# Patient Record
Sex: Male | Born: 1960 | Race: White | Hispanic: No | Smoking: Current every day smoker
Health system: Southern US, Community
[De-identification: ages and names within clinical notes are randomized; demographics above are authoritative.]

## PROBLEM LIST (undated history)

## (undated) DIAGNOSIS — F32A Depression, unspecified: Secondary | ICD-10-CM

## (undated) DIAGNOSIS — F1011 Alcohol abuse, in remission: Secondary | ICD-10-CM

## (undated) DIAGNOSIS — G4700 Insomnia, unspecified: Secondary | ICD-10-CM

## (undated) DIAGNOSIS — F329 Major depressive disorder, single episode, unspecified: Secondary | ICD-10-CM

## (undated) DIAGNOSIS — F419 Anxiety disorder, unspecified: Secondary | ICD-10-CM

---

## 2005-06-24 ENCOUNTER — Emergency Department: Payer: Self-pay | Admitting: Emergency Medicine

## 2005-06-24 ENCOUNTER — Other Ambulatory Visit: Payer: Self-pay

## 2006-03-02 ENCOUNTER — Other Ambulatory Visit: Payer: Self-pay

## 2006-03-02 ENCOUNTER — Emergency Department: Payer: Self-pay | Admitting: Internal Medicine

## 2006-03-30 ENCOUNTER — Emergency Department: Payer: Self-pay | Admitting: Emergency Medicine

## 2006-04-05 ENCOUNTER — Emergency Department: Payer: Self-pay | Admitting: Unknown Physician Specialty

## 2006-04-25 ENCOUNTER — Other Ambulatory Visit: Payer: Self-pay

## 2006-04-25 ENCOUNTER — Emergency Department: Payer: Self-pay | Admitting: Emergency Medicine

## 2006-05-08 ENCOUNTER — Emergency Department: Payer: Self-pay

## 2006-05-23 ENCOUNTER — Emergency Department: Payer: Self-pay | Admitting: Emergency Medicine

## 2006-08-09 ENCOUNTER — Emergency Department: Payer: Self-pay | Admitting: Emergency Medicine

## 2006-10-06 ENCOUNTER — Emergency Department: Payer: Self-pay | Admitting: General Practice

## 2006-11-20 ENCOUNTER — Emergency Department: Payer: Self-pay | Admitting: Emergency Medicine

## 2007-03-28 ENCOUNTER — Emergency Department: Payer: Self-pay | Admitting: Emergency Medicine

## 2007-04-03 ENCOUNTER — Inpatient Hospital Stay: Payer: Self-pay | Admitting: Unknown Physician Specialty

## 2007-06-03 ENCOUNTER — Other Ambulatory Visit: Payer: Self-pay

## 2007-06-03 ENCOUNTER — Emergency Department: Payer: Self-pay | Admitting: Emergency Medicine

## 2007-08-06 ENCOUNTER — Other Ambulatory Visit: Payer: Self-pay

## 2007-08-06 ENCOUNTER — Emergency Department: Payer: Self-pay | Admitting: Emergency Medicine

## 2007-09-30 ENCOUNTER — Other Ambulatory Visit: Payer: Self-pay

## 2007-09-30 ENCOUNTER — Emergency Department: Payer: Self-pay | Admitting: Internal Medicine

## 2007-10-14 ENCOUNTER — Emergency Department: Payer: Self-pay | Admitting: Emergency Medicine

## 2007-10-17 ENCOUNTER — Other Ambulatory Visit: Payer: Self-pay

## 2007-10-17 ENCOUNTER — Emergency Department: Payer: Self-pay | Admitting: Emergency Medicine

## 2007-10-28 ENCOUNTER — Emergency Department: Payer: Self-pay | Admitting: Emergency Medicine

## 2007-11-15 ENCOUNTER — Other Ambulatory Visit: Payer: Self-pay

## 2007-11-15 ENCOUNTER — Inpatient Hospital Stay: Payer: Self-pay | Admitting: Psychiatry

## 2007-12-30 ENCOUNTER — Emergency Department: Payer: Self-pay | Admitting: Internal Medicine

## 2008-01-01 ENCOUNTER — Other Ambulatory Visit: Payer: Self-pay

## 2008-01-01 ENCOUNTER — Inpatient Hospital Stay: Payer: Self-pay | Admitting: Internal Medicine

## 2008-01-15 ENCOUNTER — Emergency Department: Payer: Self-pay | Admitting: Emergency Medicine

## 2008-01-15 ENCOUNTER — Other Ambulatory Visit: Payer: Self-pay

## 2008-04-10 ENCOUNTER — Inpatient Hospital Stay: Payer: Self-pay | Admitting: Psychiatry

## 2008-04-10 ENCOUNTER — Other Ambulatory Visit: Payer: Self-pay

## 2008-07-09 ENCOUNTER — Emergency Department: Payer: Self-pay | Admitting: Unknown Physician Specialty

## 2008-12-20 ENCOUNTER — Emergency Department: Payer: Self-pay | Admitting: Emergency Medicine

## 2009-01-20 ENCOUNTER — Emergency Department: Payer: Self-pay | Admitting: Emergency Medicine

## 2010-04-24 ENCOUNTER — Emergency Department: Payer: Self-pay | Admitting: Emergency Medicine

## 2010-04-27 ENCOUNTER — Inpatient Hospital Stay: Payer: Self-pay | Admitting: Psychiatry

## 2011-01-01 ENCOUNTER — Emergency Department: Payer: Self-pay | Admitting: Emergency Medicine

## 2011-01-31 ENCOUNTER — Emergency Department: Payer: Self-pay | Admitting: Emergency Medicine

## 2011-02-22 ENCOUNTER — Emergency Department: Payer: Self-pay | Admitting: Emergency Medicine

## 2011-04-02 ENCOUNTER — Emergency Department: Payer: Self-pay | Admitting: Emergency Medicine

## 2011-04-06 ENCOUNTER — Emergency Department: Payer: Self-pay | Admitting: Emergency Medicine

## 2011-04-12 ENCOUNTER — Emergency Department: Payer: Self-pay | Admitting: Emergency Medicine

## 2011-04-21 ENCOUNTER — Emergency Department: Payer: Self-pay | Admitting: Emergency Medicine

## 2011-04-29 ENCOUNTER — Emergency Department: Payer: Self-pay | Admitting: Emergency Medicine

## 2011-09-25 ENCOUNTER — Emergency Department: Payer: Self-pay | Admitting: *Deleted

## 2011-09-25 LAB — TSH: Thyroid Stimulating Horm: 1.83 u[IU]/mL

## 2011-09-25 LAB — ETHANOL
Ethanol %: 0.214 % — ABNORMAL HIGH (ref 0.000–0.080)
Ethanol: 214 mg/dL

## 2011-09-25 LAB — CBC
HCT: 40.2 % (ref 40.0–52.0)
HGB: 13.6 g/dL (ref 13.0–18.0)
MCH: 35.7 pg — ABNORMAL HIGH (ref 26.0–34.0)
MCHC: 33.8 g/dL (ref 32.0–36.0)
Platelet: 215 10*3/uL (ref 150–440)
RBC: 3.82 10*6/uL — ABNORMAL LOW (ref 4.40–5.90)
RDW: 14.8 % — ABNORMAL HIGH (ref 11.5–14.5)
WBC: 4.7 10*3/uL (ref 3.8–10.6)

## 2011-09-25 LAB — COMPREHENSIVE METABOLIC PANEL
Alkaline Phosphatase: 68 U/L (ref 50–136)
BUN: 13 mg/dL (ref 7–18)
Bilirubin,Total: 0.2 mg/dL (ref 0.2–1.0)
Calcium, Total: 8.7 mg/dL (ref 8.5–10.1)
Chloride: 106 mmol/L (ref 98–107)
EGFR (African American): >60
Glucose: 121 mg/dL — ABNORMAL HIGH (ref 65–99)
SGOT(AST): 36 U/L (ref 15–37)
SGPT (ALT): 31 U/L
Total Protein: 7.1 g/dL (ref 6.4–8.2)

## 2011-09-25 LAB — DRUG SCREEN, URINE
Amphetamines, Ur Screen: NEGATIVE (ref ?–1000)
Cocaine Metabolite,Ur ~~LOC~~: NEGATIVE (ref ?–300)
MDMA (Ecstasy)Ur Screen: NEGATIVE (ref ?–500)
Opiate, Ur Screen: NEGATIVE (ref ?–300)
Tricyclic, Ur Screen: NEGATIVE (ref ?–1000)

## 2011-12-21 ENCOUNTER — Emergency Department: Payer: Self-pay | Admitting: Emergency Medicine

## 2011-12-21 LAB — URINALYSIS, COMPLETE
Bacteria: NONE SEEN
Bilirubin,UR: NEGATIVE
Bilirubin,UR: NEGATIVE
Blood: NEGATIVE
Blood: NEGATIVE
Glucose,UR: NEGATIVE mg/dL (ref 0–75)
Glucose,UR: NEGATIVE mg/dL (ref 0–75)
Ketone: NEGATIVE
Ketone: NEGATIVE
Leukocyte Esterase: NEGATIVE
Nitrite: NEGATIVE
Ph: 5 (ref 4.5–8.0)
Protein: NEGATIVE
Protein: NEGATIVE
RBC,UR: 1 /HPF (ref 0–5)
RBC,UR: NONE SEEN /HPF (ref 0–5)
Specific Gravity: 1.002 (ref 1.003–1.030)
Specific Gravity: 1.002 (ref 1.003–1.030)
Squamous Epithelial: NONE SEEN
Squamous Epithelial: NONE SEEN
WBC UR: NONE SEEN /HPF (ref 0–5)

## 2011-12-21 LAB — CBC
HGB: 12.6 g/dL — ABNORMAL LOW (ref 13.0–18.0)
MCH: 35.3 pg — ABNORMAL HIGH (ref 26.0–34.0)
MCHC: 33.8 g/dL (ref 32.0–36.0)
RBC: 3.57 10*6/uL — ABNORMAL LOW (ref 4.40–5.90)
WBC: 3.5 10*3/uL — ABNORMAL LOW (ref 3.8–10.6)

## 2011-12-21 LAB — COMPREHENSIVE METABOLIC PANEL
Albumin: 3.7 g/dL (ref 3.4–5.0)
Alkaline Phosphatase: 72 U/L (ref 50–136)
Anion Gap: 9 (ref 7–16)
BUN: 9 mg/dL (ref 7–18)
Bilirubin,Total: 0.3 mg/dL (ref 0.2–1.0)
Calcium, Total: 8.3 mg/dL — ABNORMAL LOW (ref 8.5–10.1)
Chloride: 107 mmol/L (ref 98–107)
Co2: 26 mmol/L (ref 21–32)
Creatinine: 0.58 mg/dL — ABNORMAL LOW (ref 0.60–1.30)
EGFR (Non-African Amer.): >60
Potassium: 3.5 mmol/L (ref 3.5–5.1)
SGPT (ALT): 18 U/L
Total Protein: 7.1 g/dL (ref 6.4–8.2)

## 2011-12-21 LAB — DRUG SCREEN, URINE
Amphetamines, Ur Screen: NEGATIVE (ref ?–1000)
Barbiturates, Ur Screen: NEGATIVE (ref ?–200)
Benzodiazepine, Ur Scrn: NEGATIVE (ref ?–200)
Cannabinoid 50 Ng, Ur ~~LOC~~: NEGATIVE (ref ?–50)
MDMA (Ecstasy)Ur Screen: NEGATIVE (ref ?–500)
Methadone, Ur Screen: NEGATIVE (ref ?–300)
Opiate, Ur Screen: NEGATIVE (ref ?–300)
Tricyclic, Ur Screen: NEGATIVE (ref ?–1000)

## 2011-12-21 LAB — ETHANOL: Ethanol %: 0.308 % (ref 0.000–0.080)

## 2012-01-03 ENCOUNTER — Emergency Department: Payer: Self-pay | Admitting: Emergency Medicine

## 2012-01-03 LAB — COMPREHENSIVE METABOLIC PANEL
BUN: 7 mg/dL (ref 7–18)
Bilirubin,Total: 0.5 mg/dL (ref 0.2–1.0)
Calcium, Total: 8.8 mg/dL (ref 8.5–10.1)
Creatinine: 0.5 mg/dL — ABNORMAL LOW (ref 0.60–1.30)
Potassium: 3.8 mmol/L (ref 3.5–5.1)
SGOT(AST): 91 U/L — ABNORMAL HIGH (ref 15–37)
SGPT (ALT): 46 U/L
Total Protein: 7.7 g/dL (ref 6.4–8.2)

## 2012-01-03 LAB — SALICYLATE LEVEL: Salicylates, Serum: 5.4 mg/dL — ABNORMAL HIGH

## 2012-01-03 LAB — DRUG SCREEN, URINE
Amphetamines, Ur Screen: NEGATIVE (ref ?–1000)
Benzodiazepine, Ur Scrn: NEGATIVE (ref ?–200)
Cocaine Metabolite,Ur ~~LOC~~: NEGATIVE (ref ?–300)
Opiate, Ur Screen: NEGATIVE (ref ?–300)

## 2012-01-03 LAB — ETHANOL
Ethanol %: 0.236 % — ABNORMAL HIGH (ref 0.000–0.080)
Ethanol %: 0.1 % — ABNORMAL HIGH (ref 0.000–0.080)
Ethanol: 100 mg/dL

## 2012-01-03 LAB — ACETAMINOPHEN LEVEL: Acetaminophen: <2 ug/mL

## 2012-01-03 LAB — CBC
HCT: 43.3 % (ref 40.0–52.0)
HGB: 14.2 g/dL (ref 13.0–18.0)
MCH: 34.7 pg — ABNORMAL HIGH (ref 26.0–34.0)
MCHC: 32.7 g/dL (ref 32.0–36.0)
Platelet: 250 10*3/uL (ref 150–440)
RBC: 4.08 10*6/uL — ABNORMAL LOW (ref 4.40–5.90)
RDW: 14.7 % — ABNORMAL HIGH (ref 11.5–14.5)
WBC: 4.8 10*3/uL (ref 3.8–10.6)

## 2012-01-03 LAB — TSH: Thyroid Stimulating Horm: 1.09 u[IU]/mL

## 2012-01-31 ENCOUNTER — Emergency Department: Payer: Self-pay | Admitting: Emergency Medicine

## 2012-01-31 LAB — DRUG SCREEN, URINE
Amphetamines, Ur Screen: NEGATIVE (ref ?–1000)
Benzodiazepine, Ur Scrn: NEGATIVE (ref ?–200)
Cocaine Metabolite,Ur ~~LOC~~: NEGATIVE (ref ?–300)
MDMA (Ecstasy)Ur Screen: NEGATIVE (ref ?–500)
Methadone, Ur Screen: NEGATIVE (ref ?–300)

## 2012-01-31 LAB — URINALYSIS, COMPLETE
Bacteria: NONE SEEN
Bilirubin,UR: NEGATIVE
Glucose,UR: NEGATIVE mg/dL (ref 0–75)
Leukocyte Esterase: NEGATIVE
Ph: 6 (ref 4.5–8.0)

## 2012-01-31 LAB — CBC
HCT: 48.8 % (ref 40.0–52.0)
MCH: 36.2 pg — ABNORMAL HIGH (ref 26.0–34.0)
MCHC: 34 g/dL (ref 32.0–36.0)
RBC: 4.58 10*6/uL (ref 4.40–5.90)
WBC: 4.3 10*3/uL (ref 3.8–10.6)

## 2012-01-31 LAB — COMPREHENSIVE METABOLIC PANEL
Albumin: 4.9 g/dL (ref 3.4–5.0)
Alkaline Phosphatase: 80 U/L (ref 50–136)
Anion Gap: 7 (ref 7–16)
BUN: 7 mg/dL (ref 7–18)
Bilirubin,Total: 0.4 mg/dL (ref 0.2–1.0)
Calcium, Total: 9.1 mg/dL (ref 8.5–10.1)
Creatinine: 0.62 mg/dL (ref 0.60–1.30)
EGFR (Non-African Amer.): >60
Glucose: 91 mg/dL (ref 65–99)
Osmolality: 270 (ref 275–301)
SGOT(AST): 48 U/L — ABNORMAL HIGH (ref 15–37)
SGPT (ALT): 30 U/L
Sodium: 136 mmol/L (ref 136–145)

## 2012-01-31 LAB — ETHANOL: Ethanol %: 0.333 % (ref 0.000–0.080)

## 2012-01-31 LAB — PHENYTOIN LEVEL, TOTAL: Dilantin: <0.4 ug/mL — ABNORMAL LOW (ref 10.0–20.0)

## 2012-03-24 LAB — COMPREHENSIVE METABOLIC PANEL
Albumin: 4.2 g/dL (ref 3.4–5.0)
Anion Gap: 4 — ABNORMAL LOW (ref 7–16)
BUN: 5 mg/dL — ABNORMAL LOW (ref 7–18)
Bilirubin,Total: 0.4 mg/dL (ref 0.2–1.0)
Creatinine: 0.71 mg/dL (ref 0.60–1.30)
Glucose: 87 mg/dL (ref 65–99)
Potassium: 3.9 mmol/L (ref 3.5–5.1)
SGOT(AST): 37 U/L (ref 15–37)
Total Protein: 8.1 g/dL (ref 6.4–8.2)

## 2012-03-24 LAB — CBC
HCT: 42.8 % (ref 40.0–52.0)
HGB: 14.6 g/dL (ref 13.0–18.0)
MCH: 36.3 pg — ABNORMAL HIGH (ref 26.0–34.0)
Platelet: 222 10*3/uL (ref 150–440)
RBC: 4.02 10*6/uL — ABNORMAL LOW (ref 4.40–5.90)
WBC: 5.8 10*3/uL (ref 3.8–10.6)

## 2012-03-24 LAB — URINALYSIS, COMPLETE
Leukocyte Esterase: NEGATIVE
Nitrite: NEGATIVE
Ph: 6 (ref 4.5–8.0)
Protein: NEGATIVE
RBC,UR: NONE SEEN /HPF (ref 0–5)
WBC UR: NONE SEEN /HPF (ref 0–5)

## 2012-03-24 LAB — TSH: Thyroid Stimulating Horm: 2.08 u[IU]/mL

## 2012-03-24 LAB — ETHANOL
Ethanol %: 0.12 % — ABNORMAL HIGH (ref 0.000–0.080)
Ethanol: 120 mg/dL

## 2012-03-24 LAB — DRUG SCREEN, URINE
Amphetamines, Ur Screen: NEGATIVE (ref ?–1000)
MDMA (Ecstasy)Ur Screen: NEGATIVE (ref ?–500)
Methadone, Ur Screen: NEGATIVE (ref ?–300)
Opiate, Ur Screen: NEGATIVE (ref ?–300)
Phencyclidine (PCP) Ur S: NEGATIVE (ref ?–25)
Tricyclic, Ur Screen: NEGATIVE (ref ?–1000)

## 2012-03-24 LAB — SALICYLATE LEVEL: Salicylates, Serum: 5.4 mg/dL — ABNORMAL HIGH

## 2012-03-25 ENCOUNTER — Inpatient Hospital Stay: Payer: Self-pay | Admitting: Psychiatry

## 2012-03-25 LAB — URINALYSIS, COMPLETE
Blood: NEGATIVE
Ketone: NEGATIVE
Nitrite: NEGATIVE
Protein: NEGATIVE
RBC,UR: 1 /HPF (ref 0–5)
Specific Gravity: 1.006 (ref 1.003–1.030)
WBC UR: <1 /HPF (ref 0–5)

## 2012-03-26 LAB — BEHAVIORAL MEDICINE 1 PANEL
Albumin: 3.6 g/dL (ref 3.4–5.0)
Alkaline Phosphatase: 99 U/L (ref 50–136)
BUN: 9 mg/dL (ref 7–18)
Basophil #: 0 10*3/uL (ref 0.0–0.1)
Bilirubin,Total: 0.5 mg/dL (ref 0.2–1.0)
Co2: 26 mmol/L (ref 21–32)
Creatinine: 0.58 mg/dL — ABNORMAL LOW (ref 0.60–1.30)
EGFR (African American): >60
EGFR (Non-African Amer.): >60
Eosinophil %: 2.4 %
Glucose: 101 mg/dL — ABNORMAL HIGH (ref 65–99)
HCT: 42.9 % (ref 40.0–52.0)
HGB: 14.8 g/dL (ref 13.0–18.0)
Lymphocyte #: 1.1 10*3/uL (ref 1.0–3.6)
Lymphocyte %: 16.6 %
MCV: 107 fL — ABNORMAL HIGH (ref 80–100)
Monocyte %: 14 %
Neutrophil #: 4.5 10*3/uL (ref 1.4–6.5)
Neutrophil %: 66.3 %
Osmolality: 273 (ref 275–301)
RBC: 4.02 10*6/uL — ABNORMAL LOW (ref 4.40–5.90)
RDW: 14.3 % (ref 11.5–14.5)
SGOT(AST): 27 U/L (ref 15–37)
SGPT (ALT): 21 U/L
Sodium: 137 mmol/L (ref 136–145)
Total Protein: 7.3 g/dL (ref 6.4–8.2)
WBC: 6.8 10*3/uL (ref 3.8–10.6)

## 2012-03-28 LAB — ETHANOL: Ethanol %: 0.329 % (ref 0.000–0.080)

## 2012-04-07 ENCOUNTER — Emergency Department: Payer: Self-pay | Admitting: *Deleted

## 2012-04-07 LAB — CBC
HCT: 39 % — ABNORMAL LOW (ref 40.0–52.0)
HGB: 13.3 g/dL (ref 13.0–18.0)
MCHC: 34 g/dL (ref 32.0–36.0)
RBC: 3.65 10*6/uL — ABNORMAL LOW (ref 4.40–5.90)
RDW: 14.1 % (ref 11.5–14.5)
WBC: 5 10*3/uL (ref 3.8–10.6)

## 2012-04-07 LAB — COMPREHENSIVE METABOLIC PANEL
Anion Gap: 7 (ref 7–16)
BUN: 7 mg/dL (ref 7–18)
Bilirubin,Total: 0.2 mg/dL (ref 0.2–1.0)
Calcium, Total: 8.2 mg/dL — ABNORMAL LOW (ref 8.5–10.1)
Chloride: 106 mmol/L (ref 98–107)
Co2: 24 mmol/L (ref 21–32)
Creatinine: 0.56 mg/dL — ABNORMAL LOW (ref 0.60–1.30)
EGFR (African American): >60
EGFR (Non-African Amer.): >60
Osmolality: 271 (ref 275–301)
Potassium: 3.7 mmol/L (ref 3.5–5.1)
SGPT (ALT): 18 U/L
Total Protein: 7.3 g/dL (ref 6.4–8.2)

## 2012-04-07 LAB — TROPONIN I: Troponin-I: <0.02 ng/mL

## 2012-04-07 LAB — CK TOTAL AND CKMB (NOT AT ARMC)
CK, Total: 132 U/L (ref 35–232)
CK-MB: 1.7 ng/mL (ref 0.5–3.6)

## 2012-08-17 ENCOUNTER — Emergency Department: Payer: Self-pay | Admitting: Emergency Medicine

## 2012-08-17 LAB — COMPREHENSIVE METABOLIC PANEL
Albumin: 3.9 g/dL (ref 3.4–5.0)
Alkaline Phosphatase: 76 U/L (ref 50–136)
Bilirubin,Total: 0.3 mg/dL (ref 0.2–1.0)
Calcium, Total: 8.2 mg/dL — ABNORMAL LOW (ref 8.5–10.1)
Creatinine: 0.55 mg/dL — ABNORMAL LOW (ref 0.60–1.30)
Glucose: 94 mg/dL (ref 65–99)
SGOT(AST): 34 U/L (ref 15–37)
Total Protein: 7.6 g/dL (ref 6.4–8.2)

## 2012-08-17 LAB — URINALYSIS, COMPLETE
Bacteria: NONE SEEN
Bilirubin,UR: NEGATIVE
Ketone: NEGATIVE
Leukocyte Esterase: NEGATIVE
Nitrite: NEGATIVE
Ph: 5 (ref 4.5–8.0)
RBC,UR: NONE SEEN /HPF (ref 0–5)
Specific Gravity: 1.005 (ref 1.003–1.030)
WBC UR: NONE SEEN /HPF (ref 0–5)

## 2012-08-17 LAB — DRUG SCREEN, URINE
Amphetamines, Ur Screen: NEGATIVE (ref ?–1000)
Barbiturates, Ur Screen: NEGATIVE (ref ?–200)
Benzodiazepine, Ur Scrn: NEGATIVE (ref ?–200)
Cannabinoid 50 Ng, Ur ~~LOC~~: NEGATIVE (ref ?–50)
MDMA (Ecstasy)Ur Screen: NEGATIVE (ref ?–500)

## 2012-08-17 LAB — SALICYLATE LEVEL: Salicylates, Serum: 4.7 mg/dL — ABNORMAL HIGH

## 2012-08-17 LAB — CBC
HCT: 42.5 % (ref 40.0–52.0)
HGB: 14.1 g/dL (ref 13.0–18.0)
MCH: 35 pg — ABNORMAL HIGH (ref 26.0–34.0)
MCHC: 33.1 g/dL (ref 32.0–36.0)
RBC: 4.03 10*6/uL — ABNORMAL LOW (ref 4.40–5.90)
WBC: 5.5 10*3/uL (ref 3.8–10.6)

## 2012-08-17 LAB — ETHANOL
Ethanol %: 0.35 % (ref 0.000–0.080)
Ethanol: 350 mg/dL

## 2012-08-17 LAB — TSH: Thyroid Stimulating Horm: 1.83 u[IU]/mL

## 2012-10-02 ENCOUNTER — Emergency Department: Payer: Self-pay | Admitting: Emergency Medicine

## 2012-10-04 ENCOUNTER — Emergency Department: Payer: Self-pay | Admitting: Emergency Medicine

## 2012-10-04 LAB — DRUG SCREEN, URINE
Amphetamines, Ur Screen: NEGATIVE (ref ?–1000)
Barbiturates, Ur Screen: NEGATIVE (ref ?–200)
Benzodiazepine, Ur Scrn: NEGATIVE (ref ?–200)
Cannabinoid 50 Ng, Ur ~~LOC~~: NEGATIVE (ref ?–50)
MDMA (Ecstasy)Ur Screen: NEGATIVE (ref ?–500)
Methadone, Ur Screen: NEGATIVE (ref ?–300)
Opiate, Ur Screen: NEGATIVE (ref ?–300)
Phencyclidine (PCP) Ur S: NEGATIVE (ref ?–25)
Tricyclic, Ur Screen: NEGATIVE (ref ?–1000)

## 2012-10-04 LAB — COMPREHENSIVE METABOLIC PANEL
Alkaline Phosphatase: 98 U/L (ref 50–136)
Anion Gap: 7 (ref 7–16)
BUN: 8 mg/dL (ref 7–18)
Bilirubin,Total: 0.2 mg/dL (ref 0.2–1.0)
Calcium, Total: 8.9 mg/dL (ref 8.5–10.1)
Chloride: 107 mmol/L (ref 98–107)
EGFR (African American): >60
EGFR (Non-African Amer.): >60
Glucose: 88 mg/dL (ref 65–99)
Osmolality: 273 (ref 275–301)
SGOT(AST): 35 U/L (ref 15–37)
Sodium: 138 mmol/L (ref 136–145)
Total Protein: 7.8 g/dL (ref 6.4–8.2)

## 2012-10-04 LAB — URINALYSIS, COMPLETE
Bacteria: NONE SEEN
Bilirubin,UR: NEGATIVE
Blood: NEGATIVE
Glucose,UR: NEGATIVE mg/dL (ref 0–75)
Ketone: NEGATIVE
Leukocyte Esterase: NEGATIVE
Nitrite: NEGATIVE
Protein: NEGATIVE
RBC,UR: NONE SEEN /HPF (ref 0–5)
Specific Gravity: 1.002 (ref 1.003–1.030)
Squamous Epithelial: NONE SEEN
WBC UR: NONE SEEN /HPF (ref 0–5)

## 2012-10-04 LAB — CBC
HCT: 43 % (ref 40.0–52.0)
MCH: 35.6 pg — ABNORMAL HIGH (ref 26.0–34.0)
MCV: 104 fL — ABNORMAL HIGH (ref 80–100)
WBC: 6.2 10*3/uL (ref 3.8–10.6)

## 2012-10-04 LAB — ETHANOL
Ethanol %: 0.26 % — ABNORMAL HIGH (ref 0.000–0.080)
Ethanol: 260 mg/dL

## 2012-10-04 LAB — TSH: Thyroid Stimulating Horm: 1.12 u[IU]/mL

## 2012-10-29 ENCOUNTER — Emergency Department: Payer: Self-pay | Admitting: Internal Medicine

## 2012-10-29 LAB — COMPREHENSIVE METABOLIC PANEL
Albumin: 4.5 g/dL (ref 3.4–5.0)
Alkaline Phosphatase: 90 U/L (ref 50–136)
Anion Gap: 7 (ref 7–16)
BUN: 7 mg/dL (ref 7–18)
Bilirubin,Total: 0.3 mg/dL (ref 0.2–1.0)
Calcium, Total: 9 mg/dL (ref 8.5–10.1)
Chloride: 106 mmol/L (ref 98–107)
Co2: 27 mmol/L (ref 21–32)
EGFR (Non-African Amer.): >60
Osmolality: 277 (ref 275–301)
Potassium: 4 mmol/L (ref 3.5–5.1)
SGPT (ALT): 23 U/L (ref 12–78)
Total Protein: 8 g/dL (ref 6.4–8.2)

## 2012-10-29 LAB — URINALYSIS, COMPLETE
Blood: NEGATIVE
Ketone: NEGATIVE
Ph: 5 (ref 4.5–8.0)
Protein: NEGATIVE
RBC,UR: NONE SEEN /HPF (ref 0–5)
Specific Gravity: 1.005 (ref 1.003–1.030)

## 2012-10-29 LAB — CBC
HCT: 42.6 % (ref 40.0–52.0)
MCH: 35.8 pg — ABNORMAL HIGH (ref 26.0–34.0)
Platelet: 257 10*3/uL (ref 150–440)
RDW: 14.2 % (ref 11.5–14.5)

## 2012-10-29 LAB — TSH: Thyroid Stimulating Horm: 0.886 u[IU]/mL

## 2012-10-29 LAB — ETHANOL
Ethanol %: 0.285 % — ABNORMAL HIGH (ref 0.000–0.080)
Ethanol: 285 mg/dL

## 2012-10-29 LAB — DRUG SCREEN, URINE
Cannabinoid 50 Ng, Ur ~~LOC~~: NEGATIVE (ref ?–50)
Cocaine Metabolite,Ur ~~LOC~~: NEGATIVE (ref ?–300)
Methadone, Ur Screen: NEGATIVE (ref ?–300)
Phencyclidine (PCP) Ur S: NEGATIVE (ref ?–25)

## 2012-11-27 ENCOUNTER — Emergency Department: Payer: Self-pay | Admitting: Internal Medicine

## 2012-11-28 ENCOUNTER — Emergency Department: Payer: Self-pay | Admitting: Emergency Medicine

## 2012-11-28 LAB — DRUG SCREEN, URINE
Amphetamines, Ur Screen: NEGATIVE (ref ?–1000)
Barbiturates, Ur Screen: NEGATIVE (ref ?–200)
Cannabinoid 50 Ng, Ur ~~LOC~~: NEGATIVE (ref ?–50)
Methadone, Ur Screen: NEGATIVE (ref ?–300)
Phencyclidine (PCP) Ur S: NEGATIVE (ref ?–25)

## 2012-11-28 LAB — CBC
HCT: 43.9 % (ref 40.0–52.0)
HGB: 14.9 g/dL (ref 13.0–18.0)
MCV: 105 fL — ABNORMAL HIGH (ref 80–100)
RDW: 14.3 % (ref 11.5–14.5)

## 2012-11-28 LAB — COMPREHENSIVE METABOLIC PANEL
Albumin: 3.7 g/dL (ref 3.4–5.0)
Alkaline Phosphatase: 118 U/L (ref 50–136)
Anion Gap: 6 — ABNORMAL LOW (ref 7–16)
BUN: 10 mg/dL (ref 7–18)
Calcium, Total: 8.6 mg/dL (ref 8.5–10.1)
Chloride: 107 mmol/L (ref 98–107)
Creatinine: 0.72 mg/dL (ref 0.60–1.30)
Glucose: 95 mg/dL (ref 65–99)
Osmolality: 282 (ref 275–301)
Potassium: 3.7 mmol/L (ref 3.5–5.1)
SGPT (ALT): 25 U/L (ref 12–78)
Sodium: 142 mmol/L (ref 136–145)
Total Protein: 7.9 g/dL (ref 6.4–8.2)

## 2012-11-28 LAB — ETHANOL: Ethanol: 275 mg/dL

## 2012-11-28 LAB — URINALYSIS, COMPLETE
Bilirubin,UR: NEGATIVE
Blood: NEGATIVE
Glucose,UR: NEGATIVE mg/dL (ref 0–75)
Ph: 5 (ref 4.5–8.0)
Squamous Epithelial: <1
WBC UR: <1 /HPF (ref 0–5)

## 2012-11-28 LAB — ACETAMINOPHEN LEVEL: Acetaminophen: <2 ug/mL

## 2012-11-28 LAB — TSH: Thyroid Stimulating Horm: 2.52 u[IU]/mL

## 2012-11-28 LAB — SALICYLATE LEVEL: Salicylates, Serum: 3.6 mg/dL — ABNORMAL HIGH

## 2013-01-09 ENCOUNTER — Emergency Department: Payer: Self-pay | Admitting: Emergency Medicine

## 2013-01-09 LAB — CBC
HCT: 44.1 % (ref 40.0–52.0)
HGB: 15 g/dL (ref 13.0–18.0)
RDW: 15 % — ABNORMAL HIGH (ref 11.5–14.5)
WBC: 5.5 10*3/uL (ref 3.8–10.6)

## 2013-01-09 LAB — COMPREHENSIVE METABOLIC PANEL
Albumin: 4.1 g/dL (ref 3.4–5.0)
Alkaline Phosphatase: 93 U/L (ref 50–136)
Anion Gap: 5 — ABNORMAL LOW (ref 7–16)
BUN: 10 mg/dL (ref 7–18)
Bilirubin,Total: 0.3 mg/dL (ref 0.2–1.0)
Calcium, Total: 8.8 mg/dL (ref 8.5–10.1)
Chloride: 105 mmol/L (ref 98–107)
Creatinine: 0.53 mg/dL — ABNORMAL LOW (ref 0.60–1.30)
EGFR (African American): >60
Osmolality: 273 (ref 275–301)
Potassium: 3.9 mmol/L (ref 3.5–5.1)
SGOT(AST): 47 U/L — ABNORMAL HIGH (ref 15–37)
SGPT (ALT): 37 U/L (ref 12–78)
Sodium: 137 mmol/L (ref 136–145)
Total Protein: 7.6 g/dL (ref 6.4–8.2)

## 2013-01-09 LAB — ETHANOL: Ethanol: 281 mg/dL

## 2013-01-09 LAB — TSH: Thyroid Stimulating Horm: 1.23 u[IU]/mL

## 2013-05-13 ENCOUNTER — Emergency Department: Payer: Self-pay | Admitting: Emergency Medicine

## 2013-07-31 ENCOUNTER — Emergency Department: Payer: Self-pay | Admitting: Emergency Medicine

## 2013-07-31 LAB — COMPREHENSIVE METABOLIC PANEL
Albumin: 4.1 g/dL (ref 3.4–5.0)
Anion Gap: 4 — ABNORMAL LOW (ref 7–16)
BUN: 8 mg/dL (ref 7–18)
Bilirubin,Total: 0.3 mg/dL (ref 0.2–1.0)
Calcium, Total: 9 mg/dL (ref 8.5–10.1)
Chloride: 107 mmol/L (ref 98–107)
Co2: 29 mmol/L (ref 21–32)
EGFR (African American): >60
Glucose: 79 mg/dL (ref 65–99)
Osmolality: 277 (ref 275–301)
Potassium: 3.6 mmol/L (ref 3.5–5.1)
SGPT (ALT): 34 U/L (ref 12–78)
Sodium: 140 mmol/L (ref 136–145)
Total Protein: 7.2 g/dL (ref 6.4–8.2)

## 2013-07-31 LAB — URINALYSIS, COMPLETE
Bacteria: NONE SEEN
Glucose,UR: NEGATIVE mg/dL (ref 0–75)
Ketone: NEGATIVE
Leukocyte Esterase: NEGATIVE
Nitrite: NEGATIVE
Ph: 6 (ref 4.5–8.0)
Protein: NEGATIVE
RBC,UR: NONE SEEN /HPF (ref 0–5)
Specific Gravity: 1.002 (ref 1.003–1.030)
Squamous Epithelial: NONE SEEN
WBC UR: <1 /HPF (ref 0–5)

## 2013-07-31 LAB — DRUG SCREEN, URINE
Amphetamines, Ur Screen: NEGATIVE (ref ?–1000)
Cannabinoid 50 Ng, Ur ~~LOC~~: NEGATIVE (ref ?–50)
MDMA (Ecstasy)Ur Screen: NEGATIVE (ref ?–500)
Phencyclidine (PCP) Ur S: NEGATIVE (ref ?–25)

## 2013-07-31 LAB — CBC
HGB: 14.9 g/dL (ref 13.0–18.0)
MCH: 35.8 pg — ABNORMAL HIGH (ref 26.0–34.0)
MCHC: 34.4 g/dL (ref 32.0–36.0)
Platelet: 207 10*3/uL (ref 150–440)
RBC: 4.15 10*6/uL — ABNORMAL LOW (ref 4.40–5.90)
RDW: 14.3 % (ref 11.5–14.5)
WBC: 3.9 10*3/uL (ref 3.8–10.6)

## 2013-07-31 LAB — TSH: Thyroid Stimulating Horm: 1.37 u[IU]/mL

## 2013-07-31 LAB — ETHANOL
Ethanol %: 0.281 % — ABNORMAL HIGH (ref 0.000–0.080)
Ethanol: 281 mg/dL

## 2014-04-06 ENCOUNTER — Inpatient Hospital Stay: Payer: Self-pay | Admitting: Psychiatry

## 2014-04-06 LAB — DRUG SCREEN, URINE

## 2014-04-06 LAB — URINALYSIS, COMPLETE
BACTERIA: NONE SEEN
BILIRUBIN, UR: NEGATIVE
Blood: NEGATIVE
GLUCOSE, UR: NEGATIVE mg/dL (ref 0–75)
KETONE: NEGATIVE
Leukocyte Esterase: NEGATIVE
Nitrite: NEGATIVE
PH: 6 (ref 4.5–8.0)
Protein: NEGATIVE
RBC,UR: NONE SEEN /HPF (ref 0–5)
SQUAMOUS EPITHELIAL: NONE SEEN
Specific Gravity: 1.002 (ref 1.003–1.030)
WBC UR: NONE SEEN /HPF (ref 0–5)

## 2014-04-06 LAB — COMPREHENSIVE METABOLIC PANEL
ALK PHOS: 76 U/L
Albumin: 3.9 g/dL (ref 3.4–5.0)
Anion Gap: 9 (ref 7–16)
BILIRUBIN TOTAL: 0.4 mg/dL (ref 0.2–1.0)
BUN: 5 mg/dL — ABNORMAL LOW (ref 7–18)
CALCIUM: 8.4 mg/dL — AB (ref 8.5–10.1)
CHLORIDE: 106 mmol/L (ref 98–107)
CREATININE: 0.59 mg/dL — AB (ref 0.60–1.30)
Co2: 24 mmol/L (ref 21–32)
EGFR (African American): >60
EGFR (Non-African Amer.): >60
Glucose: 99 mg/dL (ref 65–99)
Osmolality: 275 (ref 275–301)
POTASSIUM: 3.6 mmol/L (ref 3.5–5.1)
SGOT(AST): 69 U/L — ABNORMAL HIGH (ref 15–37)
SGPT (ALT): 51 U/L (ref 12–78)
SODIUM: 139 mmol/L (ref 136–145)
TOTAL PROTEIN: 7.6 g/dL (ref 6.4–8.2)

## 2014-04-06 LAB — ACETAMINOPHEN LEVEL: Acetaminophen: <2 ug/mL

## 2014-04-06 LAB — CBC
HCT: 42.1 % (ref 40.0–52.0)
HGB: 14.1 g/dL (ref 13.0–18.0)
MCH: 35.5 pg — AB (ref 26.0–34.0)
MCHC: 33.5 g/dL (ref 32.0–36.0)
MCV: 106 fL — AB (ref 80–100)
Platelet: 164 10*3/uL (ref 150–440)
RBC: 3.97 10*6/uL — ABNORMAL LOW (ref 4.40–5.90)
RDW: 14.5 % (ref 11.5–14.5)
WBC: 4.4 10*3/uL (ref 3.8–10.6)

## 2014-04-06 LAB — SALICYLATE LEVEL: SALICYLATES, SERUM: 6.6 mg/dL — AB

## 2014-04-06 LAB — ETHANOL
ETHANOL %: 0.355 % — AB (ref 0.000–0.080)
Ethanol: 355 mg/dL

## 2014-04-09 LAB — AMMONIA: Ammonia, Plasma: 41 mcmol/L — ABNORMAL HIGH (ref 11–32)

## 2014-04-09 LAB — TSH: THYROID STIMULATING HORM: 1.66 u[IU]/mL
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
## 2014-11-13 IMAGING — CT CT HEAD WITHOUT CONTRAST
1 series · 15 of 30 positions shown, 19 images · non-contrast
Comparison: none

REASON FOR EXAM: s/p fall while intoxicated; c/o head pain
COMMENTS:

[Series 2: soft tissue · axial · 0.40mm/px · z∈[-95,+40]mm · 15 of 31 slices shown, 19 images]
[im 2/31  brain]
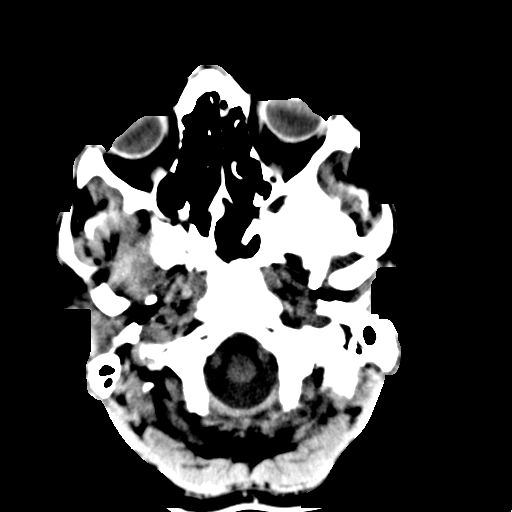
[im 2/31  bone]
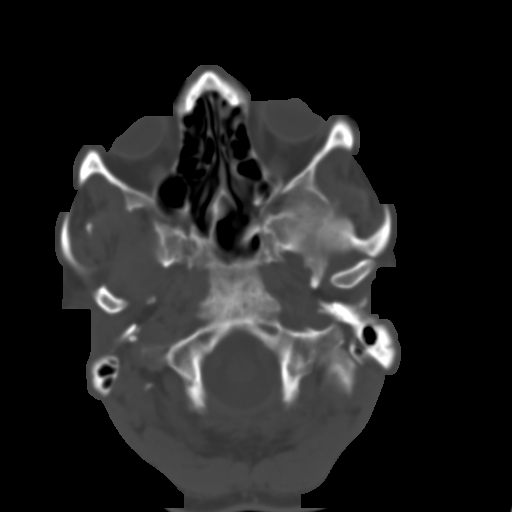
[im 4/31  brain]
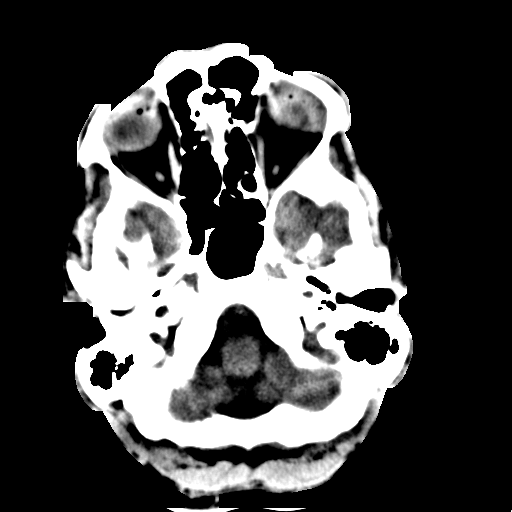
[im 6/31  brain]
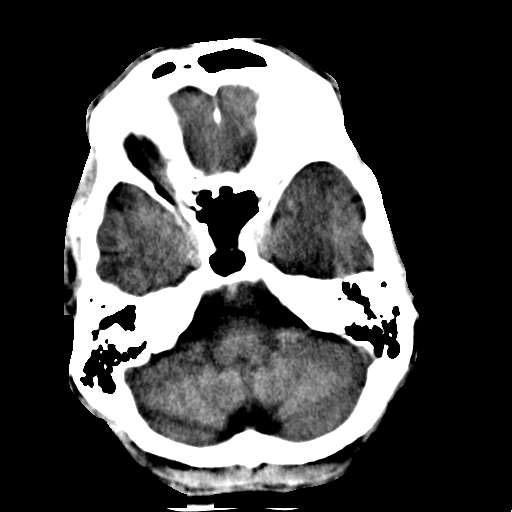
[im 8/31  brain]
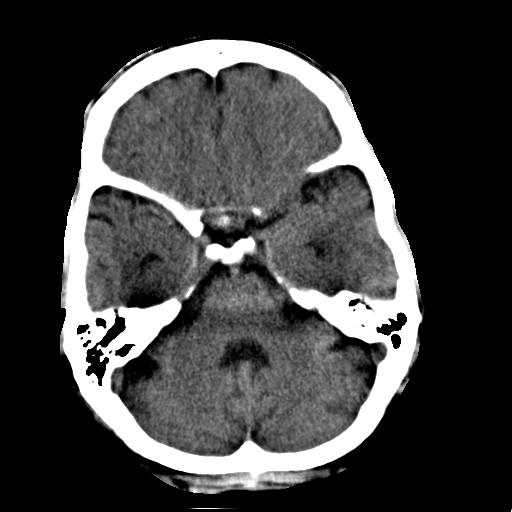
[im 10/31  brain]
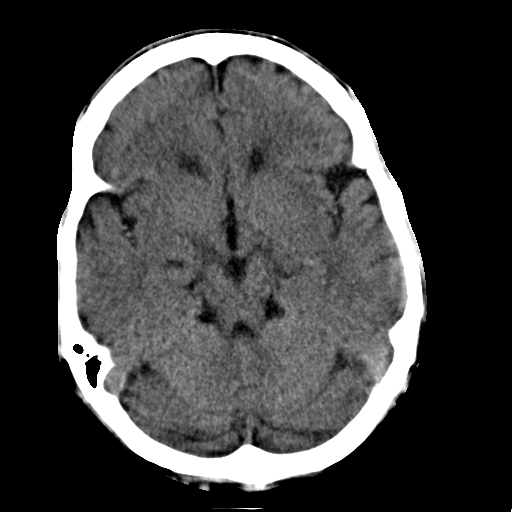
[im 10/31  bone]
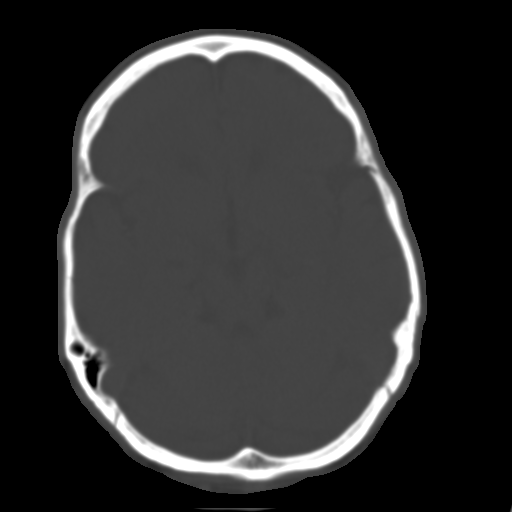
[im 12/31  brain]
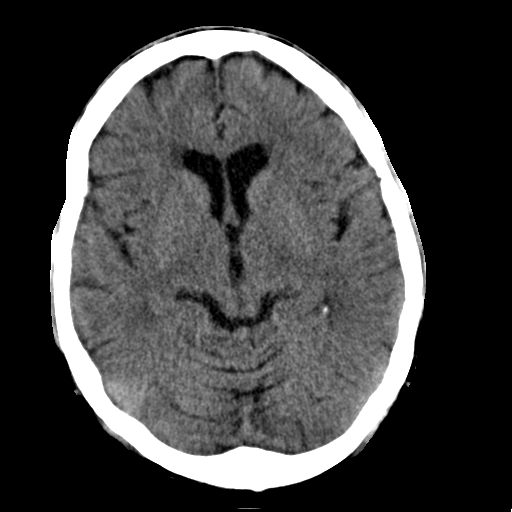
[im 14/31  brain]
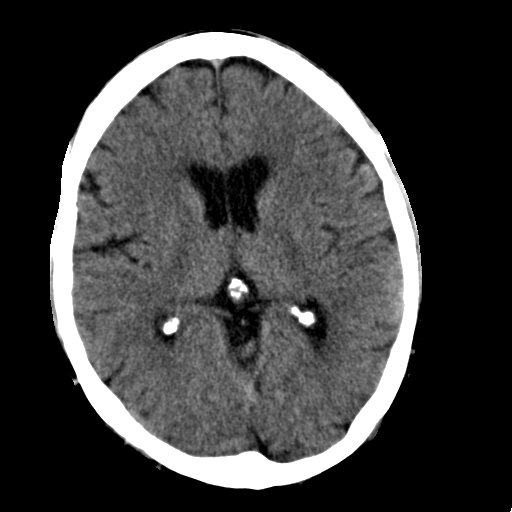
[im 16/31  brain]
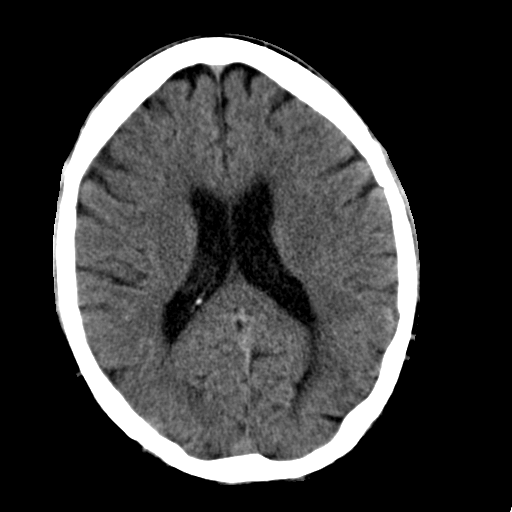
[im 17/31  brain]
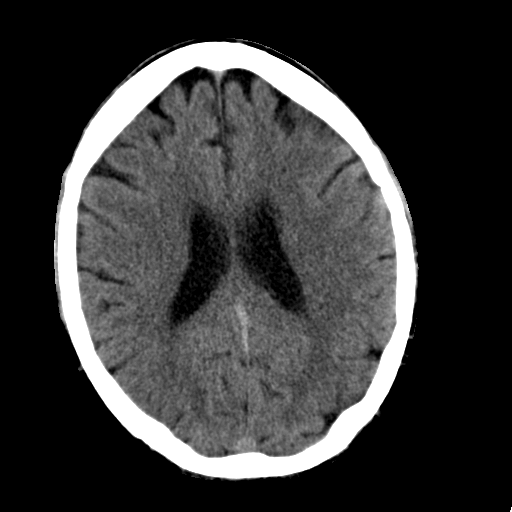
[im 17/31  bone]
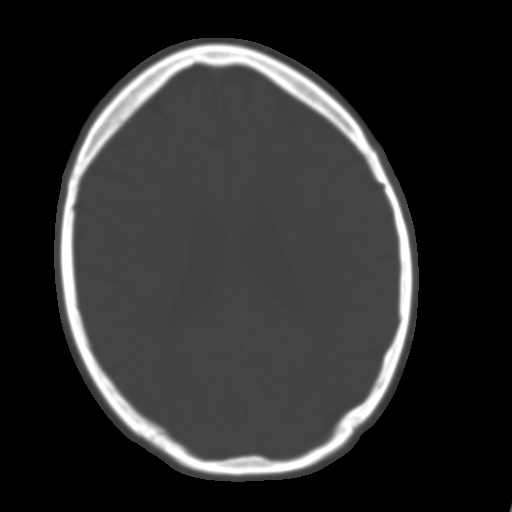
[im 19/31  brain]
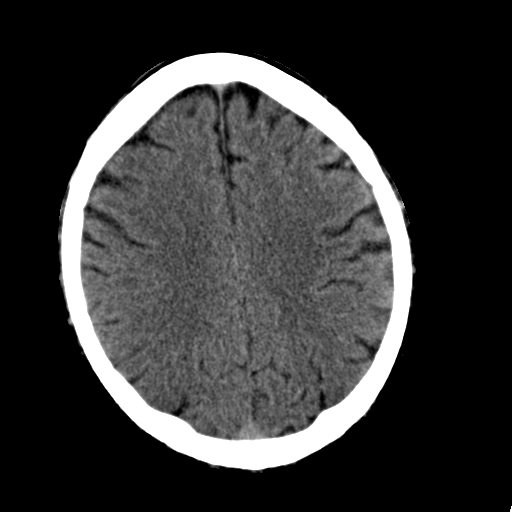
[im 21/31  brain]
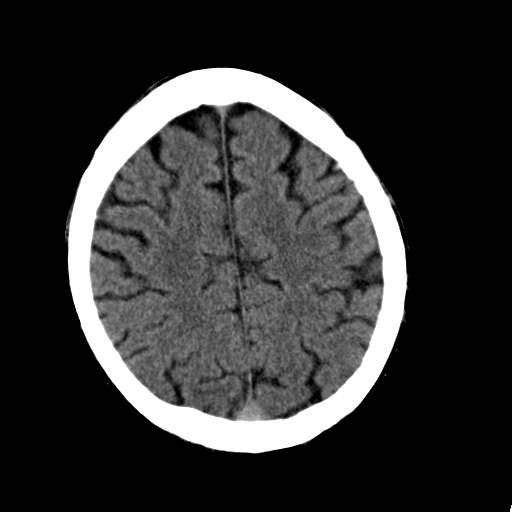
[im 23/31  brain]
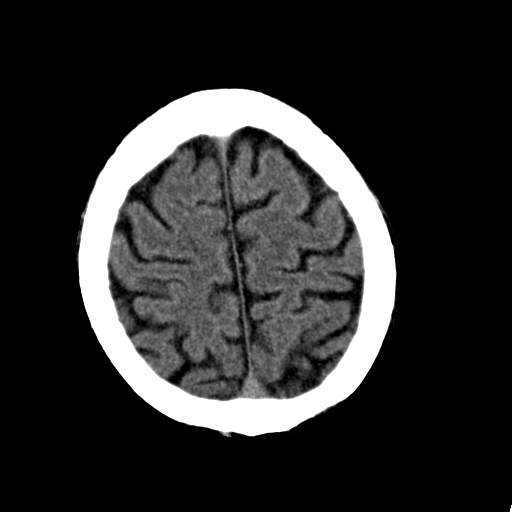
[im 25/31  brain]
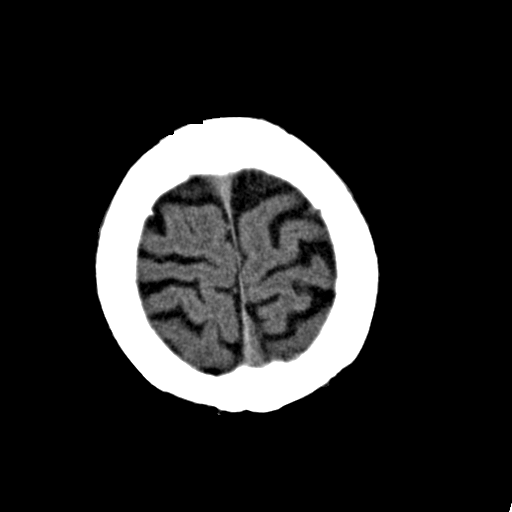
[im 25/31  bone]
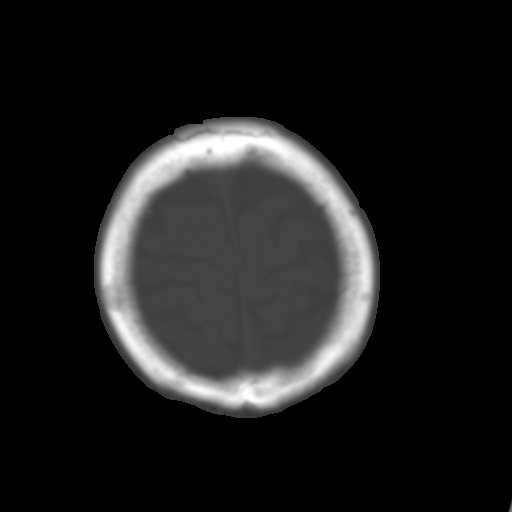
[im 27/31  brain]
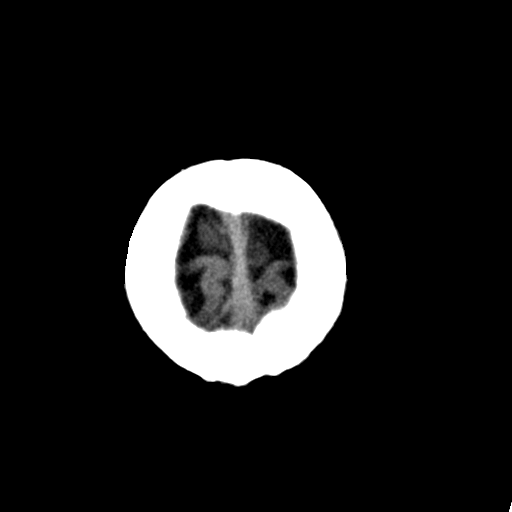
[im 29/31  brain]
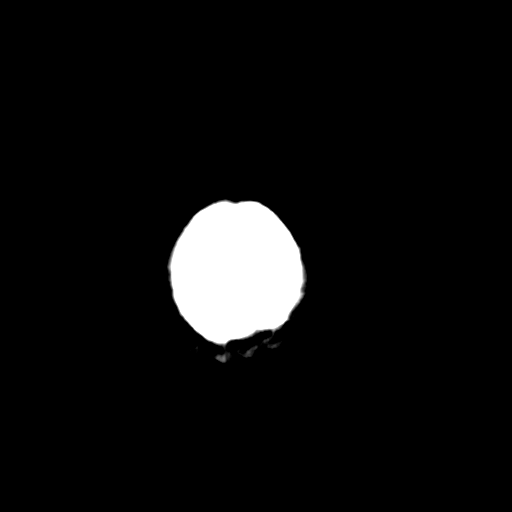

[15 of 30 positions shown; findings below may reference images not displayed]

PROCEDURE:     CT  - CT HEAD WITHOUT CONTRAST  - January 09, 2013  [DATE]

RESULT:     Axial noncontrast CT scanning was performed through the brain
with reconstructions at 5 mm intervals and slice thicknesses.

The ventricles are normal in size and position. There is no intracranial
hemorrhage nor intracranial mass effect. There is no evidence of an evolving
ischemic infarction. The cerebellum and brainstem are normal in density.
There are no abnormal intracranial calcifications.

At bone window settings the observed portions of the paranasal sinuses
exhibit no air fluid levels. There is mucoperiosteal thickening in a
posterior left ethmoid sinus cell. The mastoid air cells are well
pneumatized. There is no evidence of an acute skull fracture.
IMPRESSION: There is no acute intracranial abnormality. No significant
chronic abnormality of the brain is demonstrated. There are findings that
may reflect inflammatory change of a posterior left ethmoid sinus cell.

[REDACTED]

## 2015-01-10 NOTE — Consult Note (Signed)
PATIENT NAME:  Ian Cooper, Ian Cooper MR#:  [id number] DATE OF BIRTH:  13-Oct-1960  DATE OF CONSULTATION:  10/05/2012  REFERRING PHYSICIAN:  Enedina Finner. Manson Passey, MD CONSULTING PHYSICIAN:  Ardeen Fillers. Garnetta Buddy, MD  REASON FOR CONSULTATION: Requesting detoxification.  HISTORY OF PRESENT ILLNESS: The patient is a 54 year old male with a history of alcohol use for a long period of time, who presented to the Emergency Department as he was requesting detox. He reported that, "I'm drinking too much," and consuming approximately 4 to 5 quarts of beer per day. He reported that his last drink was before arrival to the Emergency Department.  The patient reported that he is currently becoming depressed because of his high consumption of alcohol. He is currently consuming approximately 4 to 5 quarts of beer per day. His last drink was before he called 911 to have the EMT bring him to the hospital. He reported that he is not experiencing any perceptual disturbances. He also mentioned about having intermittent suicidal ideations over the past few months. The patient reported that he currently lives on the streets and does not have any support. The patient has a history of DVTs, seizures and blackouts in the past.   During my interview, the patient reported that he has been clean and sober in the past, but he currently started hanging out with the wrong crowd and started drinking on a consistent basis. He reported that he wants to go to the rehab as he has history of sobriety in the past. He reported that he is not using any other illicit drugs, including cocaine, marijuana or pills. He stated that he started drinking at the age of 5 and has been drinking consistently since then. He currently denies having any suicidal ideations or plans, but reported that he was having thoughts to hurt himself for the past few months. He does not have a steady plan at this time.   PAST PSYCHIATRY HISTORY: The patient reported that he was admitted to  the hospital in the past because of his depression and anxiety symptoms. He reported having intermittent suicidal ideations, with no current plan or intent. He stated that he is currently homeless.   SUBSTANCE ABUSE HISTORY: The patient reported that he was consuming alcohol since he was 54 years of age. He usually drinks beer and consumes approximately 4 to 5 quarts. He has history of DTs and shakes in the past and was admitted to the hospital due to the same. He reported that he was in the [facility] for approximately 1 month and remained sober for a year. He was also in the [facility] 3 to 4 times and remained sober for a month at a time. He stated that he has no history of using cocaine, marijuana and pills. He currently smokes 1 pack of cigarettes per day since 54 years of age.    FAMILY HISTORY: The patient reported that all his family members are alcoholic, including his father, brother and sisters. He denied any history of suicide in his family.   MEDICAL HISTORY: The patient reported that he does not have any history of medical issues.   ALLERGIES: The patient currently denied having any history of allergies.  SOCIAL HISTORY: The patient reported that he receives SSDI, but he has a payee. He was married twice in the past and has 2 children. He also mentioned about having history of arrest for driving while intoxicated. He stated that he does not have any pending legal charges at this time.  LABORATORY: His  urine drug screen was negative. WBC 6.2, RBC 4.13, hemoglobin 14. 7, hematocrit 43, platelet count 231, MCV 104, MCH 25.6, MCHC 34.3, RDW 13.7. Glucose 88, BUN 8, creatinine 0.53, sodium 138, potassium 3.8, chloride 107, bicarbonate 24, calcium 8.9, bilirubin 0.2, alkaline phosphatase 98, ALT 23, AST 35, protein 7.8, albumin 4.3. Blood alcohol level was 260. Thyroid 1.12.  MENTAL STATUS EXAMINATION: The patient is a disheveled-appearing male who was lying in the bed. He maintained fair eye contact. His  mood was depressed. Affect was congruent. Thought process was logical, goal directed. Thought content was non-delusional. His speech was low in tone and volume. His memory appeared intact. His fund of knowledge was acceptable. He denied having any perceptual disturbances. He mentioned about having intermittent suicidal ideations with no concrete plan. He denied having any homicidal ideations. He demonstrated poor impulse control about use of alcohol.  DIAGNOSTIC IMPRESSION: AXIS I:  1. Alcohol dependence. 2. Depressive disorder, not otherwise specified. AXIS II: None. AXIS III: None. AXIS IV: Poor social support, problems with compliance with medications. AXIS V: Current global assessment of functioning 30.  TREATMENT PLAN: I discussed with the patient about the medication options, treatment risks, benefits and alternatives. Due to the patient's having intermittent suicidal ideations and unable to care for himself, I will initiate involuntary commitment on the patient at this time. Discussed with the patient about the same, and he agreed with the plan. He will be transferred to the [facility] for treatment of his alcohol use, and he demonstrated understanding. He will also be started on Ativan 1 mg p.o. t.i.d. for 2 days, and dose will then be tapered down to 0.5 mg p.o. t.i.d. for 2 days. He will also continue on CIWA protocol. The patient will be monitored closely by the staff. He will be given thiamine 100 mg p.o. b.i.d. to help with the consistent use of alcohol. The patient agreed with the treatment plan.  Thank you for allowing me to participate in the care of this patient.    ____________________________ Ardeen FillersUzma S. Garnetta BuddyFaheem, MD usf:OSi D: 10/05/2012 13:09:30 ET T: 10/05/2012 13:28:18 ET JOB#: [id number]  cc: Ardeen FillersUzma S. Garnetta BuddyFaheem, MD, <Dictator> Rhunette CroftUZMA S Davell Beckstead MD ELECTRONICALLY SIGNED 10/10/2012 12:10

## 2015-01-10 NOTE — Consult Note (Signed)
Brief Consult Note: Diagnosis: Alcohol Dependence, Depressive Do NOS.   Patient was seen by consultant.   Consult note dictated.   Orders entered.   Comments: Pt will be IVC  He will be transferred to [facility] for further treatment of his alcohol use  He will be given Lorazepam 1mg  po TID x 2 days then tapered to 0.5 mg po TID x 2 days.  Titrate Thiamine 100mg  po BID.   Continue to monitor.  Electronic Signatures: Rhunette CroftFaheem, Hulon Ferron S (MD)  (Signed 16-Jan-14 13:12)  Authored: Brief Consult Note   Last Updated: 16-Jan-14 13:12 by Rhunette CroftFaheem, Delmar Arriaga S (MD)

## 2015-01-11 NOTE — Discharge Summary (Signed)
PATIENT NAME:  Ian Cooper, Rian MR#:  [id number] DATE OF BIRTH:  13-Feb-1961  DATE OF ADMISSION:  04/06/2014 DATE OF DISCHARGE:  04/26/2014  REASON FOR HOSPITALIZATION: Alcohol withdrawal.  DISCHARGE DIAGNOSES:  AXIS I:  1.  Unspecified depressive disorder. 2.  Alcohol use disorder. 3.  Severe alcohol withdrawal, resolved. 4.  Tobacco use disorder. 5.  Major neurocognitive disorder (alcohol-induced dementia). AXIS III: None. AXIS IV: Limited support, low level of education. AXIS V: Global assessment of functioning 55.  DISCHARGE MEDICATIONS: 1.  Mirtazapine 15 mg p.o. at bedtime for insomnia and depression. 2.  Citalopram 40 mg p.o. daily for depression. 3.  Trazodone 50 mg p.o. at bedtime for insomnia.  HOSPITAL COURSE: The patient is a 54 year old Caucasian male, unemployed, who presented to the Emergency Department requesting help, "I need to get over drinking too much." The patient reported drinking a case of beer or more per day. Per the Emergency Room nurse, the patient had reported suicidal ideation at arrival.  For these reasons, the patient was transferred to the behavioral health unit for further evaluation. Here the patient was started on alcohol withdrawal protocol. He was given Librium, thiamine, multivitamins, and CIWA assessment was checked along with vital signs. The patient completed the detox protocol without complications from alcohol withdrawal. Once the patient was detoxified from alcohol, it was noted that the patient's cognitive state was not much improved. The patient continued to be disoriented at times to year, season, and month. The patient underwent neuropsychological screening which revealed severe neurocognitive deficits. A head CT was requested and showed mild cerebral atrophy without any other abnormalities. In order to rule out other causes of cognitive deficits, a TSH, B12, and RPR were requested. All the results were within normal limits. The possibility of  alcohol-induced dementia was considered. The patient had a prior history of depression and at admission he was continued on his home medications which included citalopram 40 mg a day. Due to the cognitive deficits, an occupational therapy consult was requested in order to assess the patient's safety to live independently without supervision. The patient did have significant deficits in his ability to function safely without supervision. With the recommendations given by OT and the findings from the neuropsychological screening, it was felt that the patient was not safe for discharge back into his previous living situation. The patient was staying with a friend prior to admission to whom he was paying rent on a monthly basis; however, the patient reports that there is drinking in the house. A social worker found out that the patient was already being followed by [facility] and they were offering payee services. They were also very concerned about his ability to function independently and had been thinking about having him placed in assisted living even prior to his admission. The team decided to pursue placement in an assisted living facility to assure his safety. The patient was initially in disagreement with this; however, he was found to have no social capacity to make decisions regarding his placement. Ultimately Protective Services and the treating team decided to request guardianship and a letter was sent supporting the need for a guardian to the [place]. The patient had a PPD which was negative. Assisted living came to the hospital and interviewed the patient. They agree with placing him in their facility, which [facility]. The [facility] was [facility] [place]. Social worker applied for special assistance and a PASRR was received. This hospitalization  was uneventful. The patient did not require any seclusions or  restraints during his stay. He was pleasant and cooperative at all times.  MENTAL STATUS EXAMINATION: At discharge, the patient is alert. He is oriented. He has good hygiene and good grooming. He is pleasant and cooperative. His eye contact was within normal limits. His psychomotor activity was within normal range. His speech had regular tone, volume and rate. Thought process is concrete. Throat content is negative for suicidality, homicidality or psychosis. Mood is euthymic. Affect reactive. Insight and judgment limited.  DISPOSITION: The patient will be discharged to [facility] assisted [facility] facility. [facility] is involved with this case and a letter supporting guardianship has been provided to them by our team.   DISCHARGE FOLLOWUP: Social worker will arrange for the patient to follow up with a local community mental health center.   ____________________________ Jimmy Footman, MD ahg:sb D: 04/26/2014 11:49:47 ET T: 04/26/2014 14:17:10 ET JOB#: [id number]  cc: Jimmy Footman, MD, <Dictator> Horton Chin MD ELECTRONICALLY SIGNED 04/29/2014 12:59

## 2015-01-11 NOTE — Consult Note (Signed)
Psychological Assessment  Ian Cooper 53of Evaluation: 7-23-14Administered: Hessie DienerBender Gestalt  Trail Making Tests Part A & B for Referral: Ian Cooper was referred for a psychological assessment by his physician, Jimmy FootmanAndrea Hernandez-Gonzalez, MD.  He was admitted to Behavioral Medicine for the treatment of suicidal ideation and alcohol detoxification.  Please see the history and physical and psychosocial history for further background information. Ian Cooper has a second grade education and states that he cannot read or write. Cognitive deficits were noted. A neuro- psychological screening was requested to quantify deficits. Ian Cooper was pleasant and cooperative with the testing process. He attempted all tasks requested of him. His performance his considered a valid indication of his current functioning. of Testing: Trail Making Test - Part A was completed in 85 seconds. He counted the numbers out loud as he moved from one to the next. Expected completion time is 27-39 seconds. Part B was not completed. The test was removed after 135 seconds. Ian Cooper was unable to complete the task without explicit direction. Expected completion is 66 to 85 seconds. Gestalt: Ian Cooper completed the test within the expected time frame. His performance reflected 7 errors: overlapping difficulty, simplification, perseveration, collision tendency, impotence, motor incoordination and angulation difficulty.  Ian Cooper performance presents strong evidence of brain impairment. Further testing would be necessary to determine the extent of the difficulties he experiences. Impression:Neurocognitive Disorder Unspecified                           Electronic Signatures: Carola FrostRoush, Ian Ann (PsyD, HSP-P)  (Signed on 24-Jul-15 14:24)  Authored  Last Updated: 24-Jul-15 14:24 by Carola Frostoush, Ian Ann (PsyD, HSP-P)

## 2015-01-11 NOTE — H&P (Signed)
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  The patient is divorced for many years and lives with a husband and wife in a trailer.  The patient comes to inpatient psychiatry at [facility] with a chief complaint, "I need to get over  drinking too much."    HISTORY OF PRESENT ILLNESS:  According to the information obtained from the patient, the patient has been drinking at a rate of a case of beer or more per day for the past few days and prior to that he has been drinking but not so much.  According to information obtained from the Emergency Room nurse, the patient had suicidal ideas along with alcohol abuse.    PAST PSYCHIATRIC HISTORY:  History of inpatient hospitalization to various alcohol detox programs on many occasions.  No history of suicide attempts though he had suicidal thoughts and ideas.  The patient reports that he attends AA meetings about twice a week.  He reports that he stayed sober for 6 months after going to alcohol detox program and started drinking alcohol again because he is around wrong crowds and bad crowds.    FAMILY HISTORY OF MENTAL ILLNESS:  Brother was depressed..  Brother committed suicide.    FAMILY HISTORY:  Raised by parents, both were alcoholic and alcohol killed them.  Has 1 sister and 2 brothers.  Sisters lives in [place] and raised him, and she wants him to move back to [place].  Admits that he is close to family.  PERSONAL HISTORY:  Born in [place], moved to [place] when his brother died and because he had to bury him because brother committed suicide.  Dropped out of school and could not make it to school and was in special education.  The patient reports that he cannot read or write.    MARRIAGES:  Married once, divorced for many years.  Has 3 adult  children.  Admits that he is in touch with the children and 1 of them works at [employer].    MEDICAL HISTORY: No H/O High blood pressure, no diabetes mellitus, no major surgeries, no major injuries.  No history of MVA and, never been unconscious.  According to information obtained from the chart, he has experienced blackouts as well as withdrawal seizures when he stops drinking.  According to the history obtained, the patient has history of hypertension.    LEGAL HISTORY:  No history of illegal drugs and no history of legal problems.  He is disabled due to medical illness and he used to work on [employer] according to the charts.    ALLERGIES:  No known drug allergies.  Being followed by Dr. Burgess Estelleanner at [facility].  Last appointment was in January, next appointment is to be made and just sees him once a year.    PHYSICAL EXAMINATION: VITAL SIGNS:  Temperature 98.4, pulse 72 per minute and regular, respirations 20 per minute and regular, blood pressure 121/76 mmHg. HEENT:  Normocephalic, atraumatic.  Eyes PERRLA. NECK:  Supple without any organomegaly, thyromegaly. CHEST:  Normal expansion, normal breath sounds heard. ABDOMEN:  Nondistended, bowel sounds are heard.  Has cirrhosis of liver.   RECTAL:  Deferred.   NEUROLOGICAL:  Gait is normal.  Cranial nerves II through XII grossly intact.  DRT's is normal throughout.  Plantars are normal response.  MENTAL STATUS EXAMINATION:  The patient is dressed in hospital scrubs, has clouding of consciousness.  He has good contact intermittent.  He stated that he was at [facility] with a little difficulty, did not know the date or time and had to look up on the wall for the name.  Fund of knowledge and language and intelligence are much below average.  This is probably because of his education level.  Memory regarding immediate, recent and remote assessment is very poor and same questions had to be repeated many times.  He knew this was [place], he said capital was [employer]  He knew that President Obama lived in [place] but could not tell me where [facility] was.  Speech involves poverty of language.  No dysarthria.  Affect is flat.  Mood is restricted and depressed.  No looseness of association.  Denies auditory or visual hallucinations, delusions or paranoid thinking.  Cognition is much below average.  Could not spell the word "world."  He said he is a poor speller.  Denies any ideas or plans to hurt himself or others.  Insight and judgment guarded.  Impulse control poor.    IMPRESSION:   AXIS I:  Alcohol dependence, chronic, continuous.  Substance-induced mood disorder. AXIS II:  Deferred.   AXIS III:  Rule out alcohol withdrawal delirium. AXIS IV:  Problems with primary support, long history of substance abuse and problems related to the same.   AXIS V:  Global assessment of functioning 25.  PLAN:  The patient will be admitted to [facility] for close observation.  On arrival, he will be started on CIWA protocol.  During the stay in the hospital, he will be given milieu therapy and supportive counseling.  He will take part in group therapy where substance abuse issues and problems will be addressed.  At the time of discharge, the patient will be stabilized and appropriate followup appointment will be made in the  community and the patient is interested in being referred for substance abuse prognosis.    ____________________________ Jannet Mantis. Guss Bunde, MD skc:cs D: 04/07/2014 17:34:00 ET T: 04/07/2014 18:52:01 ET JOB#: [id number]  cc: Monika Salk K. Guss Bunde, MD, <Dictator> Beau Fanny MD ELECTRONICALLY SIGNED 04/13/2014 18:05

## 2015-01-12 NOTE — H&P (Signed)
PATIENT NAME:  Ian Cooper, Ian Cooper MR#:  [id number] DATE OF BIRTH:  Apr 08, 1961  DATE OF ADMISSION:  03/25/2012  CHIEF COMPLAINT AND IDENTIFYING DATA: Ian Cooper is a 54 year old male presenting to the Emergency Department with an auditory hallucination of a voice along with impaired judgment and clouding of consciousness in the context of alcohol withdrawal.   HISTORY OF PRESENT ILLNESS: Ian Cooper has been drinking an unknown amount of alcohol per day and stopped.  After stopping he has experienced the onset of shakes, tremors, sweats, tachycardia, palpitations, and severe worry that he is going to have a seizure.  He does have a history of having withdrawal seizures in the past.   He is trying to avoid restarting the alcohol to stop the withdrawal symptoms. The symptoms have been occurring for approximately four hours and are progressive in severity. There are no known precipitating stresses including no known psychosocial that have caused him to drink. He is motivated for recovery. He has not been able to stop drinking on his own even though he has tried.   He is having some difficulty with memory. His judgment is impaired.  His consciousness is mildly clouded.  However, he does have intact orientation. The duration of the auditory hallucination is unclear. Ian Cooper is having inappropriate smiling.   PAST PSYCHIATRIC HISTORY: Ian Cooper does have a history of multiple periods of becoming physiologically dependent upon alcohol. He has a history of multiple periods where he has ceased drinking alcohol with results of withdrawal symptoms as described above. He also has experienced blackouts as well as withdrawal seizures.   He has undergone multiple hospitalizations for treatment of withdrawal.   He has no history of suicide attempts. It is estimated that he has been drinking 18 beers minimum per day in his recent consumption prior to withdrawal.   He was admitted to the [facility] behavioral unit in 04/2010. At that time he was having alcohol withdrawal.  FAMILY PSYCHIATRIC HISTORY:  None known.  SOCIAL HISTORY: Ian Cooper has no history of illegal drugs that he admits.  He watches TV when he is healthy.   OCCUPATION: Disabled due to his medical problems.  He used to work on an Theatre stage managerassembly line.   EDUCATION:  He was in special education but it is unclear how far he went.  MARITAL HISTORY:  Divorced after being married once.   CHILDREN:  He does have two adult children.    ALLERGIES: No known drug allergies.   LABORATORY DATA: Tylenol negative, aspirin slightly elevated at 5.4. Ethanol was 329 even by the time Ian Cooper arrived in the emergency room.  He subsequently went into withdrawal.  Urine drug screen negative. Urinalysis unremarkable.  CBC unremarkable.   REVIEW OF SYSTEMS: Constitutional, HEENT, mouth, neurologic, psychiatric, cardiovascular, respiratory, gastrointestinal, genitourinary, skin, musculoskeletal, hematologic, lymphatic, endocrine, metabolic all unremarkable.   PHYSICAL EXAMINATION:  VITAL SIGNS: Temperature 96.9, pulse 70, respiratory rate 18, blood pressure 121/75.   GENERAL APPEARANCE: Ian Cooper is a well-developed, middle-aged male sitting up on his hospital bed with no abnormal involuntary movements other than the fine general tremor which is consistent with alcohol withdrawal. He has no cachexia. He has normal muscle tone. His grooming is disheveled. Hygiene is intact.   HEENT: Head normocephalic, atraumatic. Pupils equally round and reactive to light and accommodation. Oropharynx clear without erythema.   EXTREMITIES: No cyanosis, clubbing, or edema.   SKIN: Normal turgor. No rashes. He does have some scattered tattoos.   NECK:  Supple, nontender. No masses.   LUNGS: Clear to auscultation. No wheezing, rhonchi, or rales.   ABDOMEN: Nondistended. Bowel sounds positive. Soft, nontender, no masses.   GENITOURINARY: Deferred.    NEUROLOGIC: Cranial nerves II through XII intact. General sensory intact throughout to light touch. Motor 5/5 strength throughout. Deep tendon reflexes normal strength and symmetry throughout.  No Babinski. Coordination intact by finger-to-nose bilaterally.   MENTAL STATUS EXAM:  Ian Cooper has mild clouding of consciousness. His eye contact is intermittent. He is oriented to all spheres. His fund of knowledge, use of language, and intelligence are below that of his estimated premorbid baseline. Memory- regarding immediate, recent, and remote assessment, he is a poor historian at this time. Speech involves poverty. There is no dysarthria. His prosody he is flat. Thought process is coherent with very little speech. There are no looseness of associations or tangents. Thought content: See the history of present illness.  He had no thoughts of harming himself or others.   Insight is partial. Judgment is fair. Affect is anxious.   ASSESSMENT:  AXIS I:  1. 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 This may be a component of a mild alcohol withdrawal delirium. However, there is a report that he his auditory hallucination has lasted for longer than a year.  More history and mental status assessment acutely need to be gathered to allow further evaluation and treatment. 2. Alcohol dependence with physiologic dependence.  3. Alcohol withdrawal delirium, mild.   AXIS II: Deferred.   AXIS III: Alcohol withdrawal delirium.   AXIS IV: General medical, primary support group.   AXIS V: 25.   Ian Cooper has impaired judgment. He is at risk for the severe manifestations of alcohol withdrawal.  He is also at risk for other aspects of self neglect due to his impaired judgement.   PLAN: 1. Therefore, we will admit Ian Cooper to the inpatient behavioral health unit for further evaluation and treatment. 2. He will immediately proceed with the Ativan withdrawal CIWA protocol including  thiamine supplementation and folate supplementation. He will be closely monitored with frequent vital signs.  3. He will undergo milieu and group psychotherapy as he progresses with physical improvement. 4. He will be monitored for any additional psychotropic needed.    ____________________________ Adelene Amas. Kijana Cromie, MD jsw:bjt D: 03/25/2012 21:02:12 ET T: 03/26/2012 08:25:06 ET JOB#: [id number]  cc: Adelene Amas. Orhan Mayorga, MD, <Dictator> Lester [place] MD ELECTRONICALLY SIGNED 04/03/2012 10:32

## 2015-01-12 NOTE — Discharge Summary (Signed)
PATIENT NAME:  Ian Cooper, Sharod MR#:  [id number] DATE OF BIRTH:  1960/12/29  DATE OF ADMISSION:  03/25/2012 DATE OF DISCHARGE:  03/27/2012  HISTORY OF PRESENT ILLNESS: Ian Cooper is a 54 year old male admitted to the inpatient behavioral health unit due to delirium in the context of alcohol withdrawal. He had been experiencing palpitations, sweats and tremors and had to be started on the alcohol withdrawal protocol in the emergency room.   ANCILLARY CLINICAL DATA: None.   HOSPITAL COURSE: Ian Cooper was admitted to the inpatient behavioral health unit and underwent milieu and group psychotherapy once he was capable.   After the first day he quickly became regularly ambulatory and developed a clear sensorium. His alcohol tremors resolved.    CONDITION ON DISCHARGE: By 03/27/2012 Ian Cooper has normal mood, normal is interest. His concentration is normal. His thought process is normal. He has no thoughts of harming himself or others. There was reported him experiencing a hallucination for over a year, however, this was not correct and he was no longer experiencing any hallucinations.   It was noted that he had been receiving regular Ativan right up to the point that he was requesting discharge and remaining out of withdrawal symptoms.   EXAMINATION UPON DISCHARGE: Ian Cooper is a well developed, well nourished male sitting up in his hospital chair with no abnormal involuntary movements. He has normal muscle tone. His grooming and hygiene are normal.   MENTAL STATUS EXAM UPON DISCHARGE: Ian Cooper is alert. His eye contact is good. He is oriented to all spheres. Memory is intact to immediate, recent, and remote except for the alcohol withdrawal delirium blackout. His use of language, intelligence, and fund of knowledge are normal. His speech involves normal rate and prosody without dysarthria. Thought process is logical, coherent, and goal directed. No looseness of associations or tangents.  Thought content - no thoughts of harming himself or others. No delusions or hallucinations. Affect is broad and appropriate. Mood within normal limits. Insight normal. Judgment normal.   ASSESSMENT:   AXIS I:  1. Delirium, resolved, likely alcohol withdrawal delirium.  2. Alcohol dependence.   AXIS II: Deferred.   AXIS III: Alcohol withdrawal delirium, resolved.   AXIS IV: General medical, primary support group.   AXIS V: 55.  Ian Cooper is not at risk to harm himself or others. He agrees to call emergency services immediately for any thoughts of harming himself, thoughts of harming others, or distress.   However, Ian Cooper is at risk for withdrawal given the period of substituting alcohol with Ativan and the lack of an Ativan taper.   He does have the capacity for informed consent and is well aware of the potential morbidity and mortality that could occur with benzodiazepine or alcohol withdrawal. Given this knowledge, he still wants to sign out AGAINST MEDICAL ADVICE without undergoing an inpatient Ativan taper.   In addition to the risk of withdrawal, he also understands his risk of alcohol relapse in the context of withdrawal.   DIET: Regular.   ACTIVITY: Routine.   The 12-step method is recommended. A chemical dependence inpatient residential treatment program is also recommended; however, the patient declines and he is no longer committable.      He will continue to consider substance abuse outpatient counseling as well as the 12-step method.  He does agree to [facility] if he changes his mind. ____________________________ Adelene AmasJames S. Eller Sweis, MD jsw:slb D: 03/29/2012 12:12:00 ET T: 03/29/2012 16:59:42 ET JOB#: [id number]  cc: Adelene Amas. Hayven Fatima, MD, <Dictator> Lester [place] MD ELECTRONICALLY SIGNED 04/03/2012 10:32

## 2017-09-20 ENCOUNTER — Other Ambulatory Visit: Payer: Self-pay

## 2017-09-20 ENCOUNTER — Emergency Department
Admission: EM | Admit: 2017-09-20 | Discharge: 2017-09-21 | Disposition: A | Discharge status: Home / Self Care | Payer: Medicaid Other | Attending: Emergency Medicine | Admitting: Emergency Medicine

## 2017-09-20 ENCOUNTER — Encounter: Payer: Self-pay | Admitting: Emergency Medicine

## 2017-09-20 DIAGNOSIS — F419 Anxiety disorder, unspecified: Secondary | ICD-10-CM | POA: Diagnosis present

## 2017-09-20 DIAGNOSIS — F4322 Adjustment disorder with anxiety: Secondary | ICD-10-CM

## 2017-09-20 DIAGNOSIS — F17210 Nicotine dependence, cigarettes, uncomplicated: Secondary | ICD-10-CM | POA: Insufficient documentation

## 2017-09-20 DIAGNOSIS — F32A Depression, unspecified: Secondary | ICD-10-CM

## 2017-09-20 DIAGNOSIS — F329 Major depressive disorder, single episode, unspecified: Secondary | ICD-10-CM | POA: Insufficient documentation

## 2017-09-20 HISTORY — DX: Anxiety disorder, unspecified: F41.9

## 2017-09-20 HISTORY — DX: Alcohol abuse, in remission: F10.11

## 2017-09-20 HISTORY — DX: Depression, unspecified: F32.A

## 2017-09-20 HISTORY — DX: Major depressive disorder, single episode, unspecified: F32.9

## 2017-09-20 HISTORY — DX: Insomnia, unspecified: G47.00

## 2017-09-20 LAB — COMPREHENSIVE METABOLIC PANEL
ALBUMIN: 4.6 g/dL (ref 3.5–5.0)
ALK PHOS: 55 U/L (ref 38–126)
ALT: 12 U/L — ABNORMAL LOW (ref 17–63)
AST: 26 U/L (ref 15–41)
Anion gap: 10 (ref 5–15)
BILIRUBIN TOTAL: 0.6 mg/dL (ref 0.3–1.2)
BUN: 12 mg/dL (ref 6–20)
CALCIUM: 9.5 mg/dL (ref 8.9–10.3)
CO2: 26 mmol/L (ref 22–32)
Chloride: 101 mmol/L (ref 101–111)
Creatinine, Ser: 0.82 mg/dL (ref 0.61–1.24)
GFR calc Af Amer: >60 mL/min (ref >60)
GFR calc non Af Amer: >60 mL/min (ref >60)
GLUCOSE: 104 mg/dL — AB (ref 65–99)
Potassium: 3.8 mmol/L (ref 3.5–5.1)
Sodium: 137 mmol/L (ref 135–145)
TOTAL PROTEIN: 7.7 g/dL (ref 6.5–8.1)

## 2017-09-20 LAB — URINE DRUG SCREEN, QUALITATIVE (ARMC ONLY)
Amphetamines, Ur Screen: NOT DETECTED
Barbiturates, Ur Screen: NOT DETECTED
Benzodiazepine, Ur Scrn: NOT DETECTED
COCAINE METABOLITE, UR ~~LOC~~: NOT DETECTED
Cannabinoid 50 Ng, Ur ~~LOC~~: NOT DETECTED
MDMA (Ecstasy)Ur Screen: NOT DETECTED
Methadone Scn, Ur: NOT DETECTED
OPIATE, UR SCREEN: NOT DETECTED
Phencyclidine (PCP) Ur S: NOT DETECTED
TRICYCLIC, UR SCREEN: NOT DETECTED

## 2017-09-20 LAB — CBC
HCT: 42.9 % (ref 40.0–52.0)
HEMOGLOBIN: 14.5 g/dL (ref 13.0–18.0)
MCH: 32.4 pg (ref 26.0–34.0)
MCHC: 33.8 g/dL (ref 32.0–36.0)
MCV: 95.6 fL (ref 80.0–100.0)
Platelets: 274 10*3/uL (ref 150–440)
RBC: 4.49 MIL/uL (ref 4.40–5.90)
RDW: 14.4 % (ref 11.5–14.5)
WBC: 8.1 10*3/uL (ref 3.8–10.6)

## 2017-09-20 LAB — ETHANOL: Alcohol, Ethyl (B): <10 mg/dL (ref <10)

## 2017-09-20 MED ORDER — VENLAFAXINE HCL ER 75 MG PO CP24
75.0000 mg | ORAL_CAPSULE | Freq: Every day | ORAL | Status: DC
  Administered 2017-09-21: 75 mg via ORAL
  Filled 2017-09-20: qty 1

## 2017-09-20 MED ORDER — QUETIAPINE FUMARATE 25 MG PO TABS
25.0000 mg | ORAL_TABLET | Freq: Every day | ORAL | Status: DC
  Administered 2017-09-20: 25 mg via ORAL

## 2017-09-20 NOTE — ED Provider Notes (Addendum)
[facility] Emergency Department Provider Note  ____________________________________________   First MD Initiated Contact with Patient 09/20/17 1834     (approximate)  I have reviewed the triage vital signs and the nursing notes.   HISTORY  Chief Complaint Anxiety   HPI Ian Cooper is a 57 y.o. male who comes to the emergency via EMS department requesting "something for my nerves".  He says that he formerly took an unknown medication to help keep him calm however he has not had it in some time.  He does not know the name of his medicine.  He denies suicidal ideation and he denies homicidal ideation.  He says he has a safe place to live although he is frustrated that he has not been receiving his psychiatric medications.  His symptoms have been insidious onset and constant.  Nothing seems to make them better or worse.  Past Medical History:  Diagnosis Date  . Anxiety   . Depression   . H/O ETOH abuse   . Insomnia     There are no active problems to display for this patient.   History reviewed. No pertinent surgical history.  Prior to Admission medications   Not on File    Allergies Patient has no known allergies.  No family history on file.  Social History Social History   Tobacco Use  . Smoking status: Current Every Day Smoker    Packs/day: 1.00    Types: Cigarettes  Substance Use Topics  . Alcohol use: Not on file  . Drug use: Not on file    Review of Systems Constitutional: No fever/chills Eyes: No visual changes. ENT: No sore throat. Cardiovascular: Denies chest pain. Respiratory: Denies shortness of breath. Gastrointestinal: No abdominal pain.  No nausea, no vomiting.  No diarrhea.  No constipation. Genitourinary: Negative for dysuria. Musculoskeletal: Negative for back pain. Skin: Negative for rash. Neurological: Negative for headaches, focal weakness or  numbness.   ____________________________________________   PHYSICAL EXAM:  VITAL SIGNS: ED Triage Vitals  Enc Vitals Group     BP 09/20/17 1639 (!) 142/79     Pulse Rate 09/20/17 1639 97     Resp 09/20/17 1639 18     Temp 09/20/17 1639 98.5 F (36.9 C)     Temp Source 09/20/17 1639 Oral     SpO2 09/20/17 1639 99 %     Weight 09/20/17 1640 132 lb (59.9 kg)     Height 09/20/17 1640 5\' 11"  (1.803 m)     Head Circumference --      Peak Flow --      Pain Score --      Pain Loc --      Pain Edu? --      Excl. in GC? --     Constitutional: Alert and oriented x4 somewhat strange affect nontoxic no diaphoresis speaks in full clear sentences Eyes: PERRL EOMI. Head: Atraumatic. Nose: No congestion/rhinnorhea. Mouth/Throat: No trismus Neck: No stridor.   Cardiovascular: Normal rate, regular rhythm. Grossly normal heart sounds.  Good peripheral circulation. Respiratory: Normal respiratory effort.  No retractions. Lungs CTAB and moving good air Gastrointestinal: Soft nontender Musculoskeletal: No lower extremity edema   Neurologic:  Normal speech and language. No gross focal neurologic deficits are appreciated. Skin:  Skin is warm, dry and intact. No rash noted. Psychiatric: Somewhat flat affect   ____________________________________________   DIFFERENTIAL includes but not limited to  Depression, anxiety, drug overdose, malingering ____________________________________________   LABS (all labs ordered are listed,  but only abnormal results are displayed)  Labs Reviewed  COMPREHENSIVE METABOLIC PANEL - Abnormal; Notable for the following components:      Result Value   Glucose, Bld 104 (*)    ALT 12 (*)    All other components within normal limits  ETHANOL  CBC  URINE DRUG SCREEN, QUALITATIVE ([facility] ONLY)    Lab work reviewed by me with no acute  disease __________________________________________  EKG   ____________________________________________  RADIOLOGY   ____________________________________________   PROCEDURES  Procedure(s) performed: no  Procedures  Critical Care performed: no  Observation: no ____________________________________________   INITIAL IMPRESSION / ASSESSMENT AND PLAN / ED COURSE  Pertinent labs & imaging results that were available during my care of the patient were reviewed by me and considered in my medical decision making (see chart for details).  The patient is overall quite well-appearing although he does report some anxiety.  He does contract for safety.  I offered to refill the patient's home medications and have him follow-up as an outpatient, however he requested to speak to a psychiatrist today which I think is reasonable.  Specialist on-call evaluated the patient and felt that he was severely depressed and warranted inpatient admission.  At this point he is medically stable and [facility].      ____________________________________________   FINAL CLINICAL IMPRESSION(S) / ED DIAGNOSES  Final diagnoses:  Depression, unspecified depression type      NEW MEDICATIONS STARTED DURING THIS VISIT:  This SmartLink is deprecated. Use AVSMEDLIST instead to display the medication list for a patient.   Note:  This document was prepared using Dragon voice recognition software and may include unintentional dictation errors.     Merrily Brittleifenbark, Shasha Buchbinder, MD 09/20/17 [id number]    Merrily Brittleifenbark, Raiford Fetterman, MD 09/20/17 2048

## 2017-09-20 NOTE — ED Triage Notes (Signed)
Patient arrives via EMS. States is having anxiety. Denies thoughts of harming self. From [facility]. States lives and works there.

## 2017-09-20 NOTE — BH Assessment (Signed)
Assessment Note  Ian Cooper is an 57 y.o. male [facility] requesting medications to "calm down his nerves".  Pt reports hat his assisted living facility is not making sure ha takes hi medications.  Pt denies SI/HI and any auditory/visua hallucination.  He denies any drug or alcohol use.  Diagnosis: Anxiety  Past Medical History:  Past Medical History:  Diagnosis Date  . Anxiety   . Depression   . H/O ETOH abuse   . Insomnia     History reviewed. No pertinent surgical history.  Family History: No family history on file.  Social History:  reports that he has been smoking cigarettes.  He has been smoking about 1.00 pack per day. He does not have any smokeless tobacco history on file. His alcohol and drug histories are not on file.  Additional Social History:  Alcohol / Drug Use Pain Medications: See PTA Prescriptions: See PTA Over the Counter: See PTA History of alcohol / drug use?: No history of alcohol / drug abuse  CIWA: CIWA-Ar BP: (!) 153/101 Pulse Rate: 73 COWS:    Allergies: No Known Allergies  Home Medications:  (Not in a hospital admission)  OB/GYN Status:  No LMP for male patient.  General Assessment Data [facility] Assessment: In system Is this a Tele or Face-to-Face Assessment?: Face-to-Face Is this an Initial Assessment or a Re-assessment for this encounter?: Initial Assessment Marital status: Single Maiden name: na Is patient pregnant?: No Pregnancy Status: No Living Arrangements: Group Home Can pt return to current living arrangement?: Yes Admission Status: Voluntary Is patient capable of signing voluntary admission?: No(Pt reportedly has a guardian) Referral Source: Self/Family/Friend Insurance type: Medicaid     Crisis Care Plan Living Arrangements: Group Home Legal Guardian: Other: Name of Psychiatrist: unknown Name of Therapist: unknown  Education Status Is patient  currently in school?: No Current Grade: na Highest grade of school patient has completed: na Name of school: na Contact person: na  Risk to self with the past 6 months Suicidal Ideation: No Has patient been a risk to self within the past 6 months prior to admission? : No Suicidal Intent: No Has patient had any suicidal intent within the past 6 months prior to admission? : No Is patient at risk for suicide?: No Suicidal Plan?: No Has patient had any suicidal plan within the past 6 months prior to admission? : No Access to Means: No What has been your use of drugs/alcohol within the last 12 months?: denies drug use Previous Attempts/Gestures: No Triggers for Past Attempts: None known Intentional Self Injurious Behavior: None Family Suicide History: Unknown Recent stressful life event(s): Other (Comment) Persecutory voices/beliefs?: No Depression: No Substance abuse history and/or treatment for substance abuse?: No Suicide prevention information given to non-admitted patients: Not applicable  Risk to Others within the past 6 months Homicidal Ideation: No Does patient have any lifetime risk of violence toward others beyond the six months prior to admission? : No Thoughts of Harm to Others: No Current Homicidal Intent: No Current Homicidal Plan: No Access to Homicidal Means: No Identified Victim: none identified History of harm to others?: No Assessment of Violence: None Noted Does patient have access to weapons?: No Criminal Charges Pending?: No Does patient have a court date: No Is patient on probation?: No  Psychosis Hallucinations: None noted Delusions: None noted  Mental Status Report Appearance/Hygiene: In scrubs Eye Contact: Fair Motor Activity: Echopraxia Speech: Logical/coherent Level of Consciousness:  Alert Mood: Anxious Affect: Anxious Anxiety Level: Moderate Thought Processes: Relevant Judgement: Unimpaired Orientation: Person, Place, Time,  Situation Obsessive Compulsive Thoughts/Behaviors: None  Cognitive Functioning Concentration: Normal Memory: Recent Intact, Remote Intact IQ: Average Insight: Good Impulse Control: Good Appetite: Good Weight Loss: 0 Weight Gain: 0 Sleep: No Change Vegetative Symptoms: None  ADLScreening [facility] Assessment Services) Patient's cognitive ability adequate to safely complete daily activities?: Yes Patient able to express need for assistance with ADLs?: Yes Independently performs ADLs?: Yes (appropriate for developmental age)  Prior Inpatient Therapy Prior Inpatient Therapy: No Prior Therapy Dates: na Prior Therapy Facilty/Provider(s): na Reason for Treatment: na  Prior Outpatient Therapy Prior Outpatient Therapy: Yes Prior Therapy Dates: current Prior Therapy Facilty/Provider(s): unknown Reason for Treatment: anxiety Does patient have an ACCT team?: Unknown Does patient have Intensive In-House Services?  : No Does patient have [facility] services? : Unknown Does patient have P4CC services?: Unknown  ADL Screening (condition at time of admission) Patient's cognitive ability adequate to safely complete daily activities?: Yes Patient able to express need for assistance with ADLs?: Yes Independently performs ADLs?: Yes (appropriate for developmental age)       Abuse/Neglect Assessment (Assessment to be complete while patient is alone) Abuse/Neglect Assessment Can Be Completed: Yes Physical Abuse: Denies Sexual Abuse: Denies Exploitation of patient/patient's resources: Denies Self-Neglect: Denies Values / Beliefs Cultural Requests During Hospitalization: None Spiritual Requests During Hospitalization: None Consults Spiritual Care Consult Needed: No Social Work Consult Needed: No      Additional Information 1:1 In Past 12 Months?: No CIRT Risk: No Elopement Risk: No Does patient have medical clearance?: Yes     Disposition:  Disposition Initial Assessment Completed  for this Encounter: Yes Disposition of Patient: Pending Review with psychiatrist  On Site Evaluation by:   Reviewed with Physician:    [place] 09/20/2017 11:52 PM

## 2017-09-20 NOTE — ED Notes (Signed)
SOC set up at bedside. 

## 2017-09-20 NOTE — ED Notes (Signed)
Patient is voluntary and is pending psych consult. 

## 2017-09-20 NOTE — ED Notes (Signed)
Per MD order, pt is not a psych patient that needs to be dressed out and placed in a psych room. Pt is getting a telepsych consult done.
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
## 2017-09-21 DIAGNOSIS — F4322 Adjustment disorder with anxiety: Secondary | ICD-10-CM | POA: Diagnosis not present

## 2017-09-21 NOTE — ED Notes (Signed)
Pt discharged to lobby. Pt denies SI/HI and AVH.  All belongings returned to patient.  Pt accepting of disposition.

## 2017-09-21 NOTE — ED Notes (Signed)
Patient resting quietly in room. No noted distress or abnormal behaviors noted. Will continue 15 minute checks and observation by security camera for safety. 

## 2017-09-21 NOTE — ED Notes (Signed)
Pt states that unless he is transferred to the in patient unit soon he would rather be discharged.

## 2017-09-21 NOTE — ED Notes (Signed)
Voluntary/ recommend psychiatric inpatient admission when medically cleared

## 2017-09-21 NOTE — ED Provider Notes (Signed)
Discussed with Dr. Toni Amendlapacs after his evaluation. Feels the patient is psychiatrically stable and not a danger to himself or others. Suitable for outpatient follow-up at this time. Remains medically stable with normal vital signs.   Sharman CheekStafford, Kolter Reaver, MD 09/21/17 1416

## 2017-09-21 NOTE — ED Notes (Signed)
Pt requesting to be discharged from ER.  Pt doesn't want to wait for inpatient bed to become available. Denies SI/HI and AVH.  RN explained she would speak with EDP. Pt accepting. Maintained on 15 minute checks and observation by security camera for safety.

## 2017-09-21 NOTE — ED Notes (Signed)
Pt to be discharged today. Given supplies to take a shower.  Maintained on 15 minute checks and observation by security camera for safety.

## 2017-09-21 NOTE — Consult Note (Signed)
[place] [facility] Consult   Reason for Consult: Consult for 56 year old man with a past history of anxiety and a past history of alcohol abuse Referring Physician: Joni Fears Patient Identification: Ian Cooper MRN:  [id number] Principal Diagnosis: Adjustment disorder with anxiety Diagnosis:   Patient Active Problem List   Diagnosis Date Noted  . Adjustment disorder with anxiety [F43.22] 09/21/2017    Total Time spent with patient: 1 hour  Subjective:   Ian Cooper is a 57 y.o. male patient admitted with I have been under some stress and my nerves are bad  HPI: Patient interviewed chart reviewed.  57 year old man with a history of anxiety and alcohol abuse.  He says about 1 week ago he and his wife split up.  He found her with another man and threw her out of the house.  He has been having some trouble sleeping since then.  Feeling nervous and jittery and feeling down on himself.  Denies having any suicidal thought at any time.  Denies any psychotic symptoms.  He is proud that he has not relapsed on alcohol which he used to have a problem with.  He says last night when he came in he was feeling pretty stressed out and jittery and wanted to talk to somebody but now that he has slept on it he is having a better outlook on things and definitely feels like he is going to be coping with this all right.  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  Just split up with his wife.  Medical history: Past history of alcohol abuse but not using anymore.  Denies any major medical problems ongoing now.  Substance abuse history: Used to drink but has not had a drink in 6 years.  Denies drug abuse.  Past Psychiatric History: No previous hospitalizations.  No history of suicide attempts.  He sees a doctor locally who gives him "nerve pills".  He is not sure what they are but says he does not abuse them.  Does not know of any other  medicine he has been prescribed.  No history of psychotic symptoms.  Risk to Self: Suicidal Ideation: No Suicidal Intent: No Is patient at risk for suicide?: No Suicidal Plan?: No Access to Means: No What has been your use of drugs/alcohol within the last 12 months?: denies drug use Triggers for Past Attempts: None known Intentional Self Injurious Behavior: None Risk to Others: Homicidal Ideation: No Thoughts of Harm to Others: No Current Homicidal Intent: No Current Homicidal Plan: No Access to Homicidal Means: No Identified Victim: none identified History of harm to others?: No Assessment of Violence: None Noted Does patient have access to weapons?: No Criminal Charges Pending?: No Does patient have a court date: No Prior Inpatient Therapy: Prior Inpatient Therapy: No Prior Therapy Dates: na Prior Therapy Facilty/Provider(s): na Reason for Treatment: na Prior Outpatient Therapy: Prior Outpatient Therapy: Yes Prior Therapy Dates: current Prior Therapy Facilty/Provider(s): unknown Reason for Treatment: anxiety Does patient have an ACCT team?: Unknown Does patient have Intensive In-House Services?  : No Does patient have [facility] services? : Unknown Does patient have P4CC services?: Unknown  Past Medical History:  Past Medical History:  Diagnosis Date  . Anxiety   . Depression   . H/O ETOH abuse   . Insomnia    History reviewed. No pertinent surgical history. Family History: No family history on file. Family Psychiatric  History: Does not  know of any Social History:  Social History   Substance and Sexual Activity  Alcohol Use Not on file     Social History   Substance and Sexual Activity  Drug Use Not on file    Social History   Socioeconomic History  . Marital status: Divorced    Spouse name: None  . Number of children: None  . Years of education: None  . Highest education level: None  Social Needs  . Financial resource strain: None  . Food insecurity -  worry: None  . Food insecurity - inability: None  . Transportation needs - medical: None  . Transportation needs - non-medical: None  Occupational History  . None  Tobacco Use  . Smoking status: Current Every Day Smoker    Packs/day: 1.00    Types: Cigarettes  Substance and Sexual Activity  . Alcohol use: None  . Drug use: None  . Sexual activity: None  Other Topics Concern  . None  Social History Narrative  . None   Additional Social History:    Allergies:  No Known Allergies  Labs:  Results for orders placed or performed during the hospital encounter of 09/20/17 (from the past 48 hour(s))  Comprehensive metabolic panel     Status: Abnormal   Collection Time: 09/20/17  4:38 PM  Result Value Ref Range   Sodium 137 135 - 145 mmol/L   Potassium 3.8 3.5 - 5.1 mmol/L   Chloride 101 101 - 111 mmol/L   CO2 26 22 - 32 mmol/L   Glucose, Bld 104 (H) 65 - 99 mg/dL   BUN 12 6 - 20 mg/dL   Creatinine, Ser 0.82 0.61 - 1.24 mg/dL   Calcium 9.5 8.9 - 10.3 mg/dL   Total Protein 7.7 6.5 - 8.1 g/dL   Albumin 4.6 3.5 - 5.0 g/dL   AST 26 15 - 41 U/L   ALT 12 (L) 17 - 63 U/L   Alkaline Phosphatase 55 38 - 126 U/L   Total Bilirubin 0.6 0.3 - 1.2 mg/dL   GFR calc non Af Amer >60 >60 mL/min   GFR calc Af Amer >60 >60 mL/min    Comment: (NOTE) The eGFR has been calculated using the CKD EPI equation. This calculation has not been validated in all clinical situations. eGFR's persistently <60 mL/min signify possible Chronic Kidney Disease.    Anion gap 10 5 - 15    Comment: Performed at [facility], [address] [place], [place] [address]  Ethanol     Status: None   Collection Time: 09/20/17  4:38 PM  Result Value Ref Range   Alcohol, Ethyl (B) <10 <10 mg/dL    Comment:        LOWEST DETECTABLE LIMIT FOR SERUM ALCOHOL IS 10 mg/dL FOR MEDICAL PURPOSES ONLY Performed at [facility], [address] [place], [place] [address]   cbc     Status: None    Collection Time: 09/20/17  4:38 PM  Result Value Ref Range   WBC 8.1 3.8 - 10.6 K/uL   RBC 4.49 4.40 - 5.90 MIL/uL   Hemoglobin 14.5 13.0 - 18.0 g/dL   HCT 42.9 40.0 - 52.0 %   MCV 95.6 80.0 - 100.0 fL   MCH 32.4 26.0 - 34.0 pg   MCHC 33.8 32.0 - 36.0 g/dL   RDW 14.4 11.5 - 14.5 %   Platelets 274 150 - 440 K/uL    Comment: Performed at [facility], [address] [place], [place] [address]  Urine Drug Screen, Qualitative     Status: None   Collection Time: 09/20/17  4:42 PM  Result Value Ref Range   Tricyclic, Ur Screen NONE DETECTED NONE DETECTED   Amphetamines, Ur Screen NONE DETECTED NONE DETECTED   MDMA (Ecstasy)Ur Screen NONE DETECTED NONE DETECTED   Cocaine Metabolite,Ur [place] NONE DETECTED NONE DETECTED   Opiate, Ur Screen NONE DETECTED NONE DETECTED   Phencyclidine (PCP) Ur S NONE DETECTED NONE DETECTED   Cannabinoid 50 Ng, Ur [place] NONE DETECTED NONE DETECTED   Barbiturates, Ur Screen NONE DETECTED NONE DETECTED   Benzodiazepine, Ur Scrn NONE DETECTED NONE DETECTED   Methadone Scn, Ur NONE DETECTED NONE DETECTED    Comment: (NOTE) Tricyclics + metabolites, urine    Cutoff 1000 ng/mL Amphetamines + metabolites, urine  Cutoff 1000 ng/mL MDMA (Ecstasy), urine              Cutoff 500 ng/mL Cocaine Metabolite, urine          Cutoff 300 ng/mL Opiate + metabolites, urine        Cutoff 300 ng/mL Phencyclidine (PCP), urine         Cutoff 25 ng/mL Cannabinoid, urine                 Cutoff 50 ng/mL Barbiturates + metabolites, urine  Cutoff 200 ng/mL Benzodiazepine, urine              Cutoff 200 ng/mL Methadone, urine                   Cutoff 300 ng/mL The urine drug screen provides only a preliminary, unconfirmed analytical test result and should not be used for non-medical purposes. Clinical consideration and professional judgment should be applied to any positive drug screen result due to possible interfering substances. A more specific alternate chemical method must be  used in order to obtain a confirmed analytical result. Gas chromatography / mass spectrometry (GC/MS) is the preferred confirmat ory method. Performed at [facility], [address] [place], [place] [address]     Current Facility-Administered Medications  Medication Dose Route Frequency Provider Last Rate Last Dose  . QUEtiapine (SEROQUEL) tablet 25 mg  25 mg Oral QHS Darel Hong, MD   25 mg at 09/20/17 2234  . venlafaxine XR (EFFEXOR-XR) 24 hr capsule 75 mg  75 mg Oral Q breakfast Darel Hong, MD   75 mg at 09/21/17 0818   No current outpatient medications on file.    Musculoskeletal: Strength & Muscle Tone: within normal limits Gait & Station: normal Patient leans: N/A  Psychiatric Specialty Exam: Physical Exam  Nursing note and vitals reviewed. Constitutional: He appears well-developed and well-nourished.  HENT:  Head: Normocephalic and atraumatic.  Eyes: Conjunctivae are normal. Pupils are equal, round, and reactive to light.  Neck: Normal range of motion.  Cardiovascular: Regular rhythm and normal heart sounds.  Respiratory: Effort normal. No respiratory distress.  GI: Soft.  Musculoskeletal: Normal range of motion.  Neurological: He is alert.  Skin: Skin is warm and dry.  Psychiatric: He has a normal mood and affect. His speech is normal and behavior is normal. Judgment and thought content normal. Cognition and memory are normal.    Review of Systems  Constitutional: Negative.   HENT: Negative.   Eyes: Negative.   Respiratory: Negative.   Cardiovascular: Negative.   Gastrointestinal: Negative.   Musculoskeletal: Negative.   Skin: Negative.   Neurological: Negative.   Psychiatric/Behavioral: Negative.  Blood pressure 140/78, pulse 71, temperature 98.1 F (36.7 C), temperature source Oral, resp. rate 18, height '5\' 11"'  (1.803 m), weight 132 lb (59.9 kg), SpO2 100 %.Body mass index is 18.41 kg/m.  General Appearance: Casual  Eye Contact:   Fair  Speech:  Normal Rate  Volume:  Normal  Mood:  Euthymic  Affect:  Congruent  Thought Process:  Goal Directed  Orientation:  Full (Time, Place, and Person)  Thought Content:  Logical  Suicidal Thoughts:  No  Homicidal Thoughts:  No  Memory:  Immediate;   Fair Recent;   Fair Remote;   Fair  Judgement:  Fair  Insight:  Fair  Psychomotor Activity:  Normal  Concentration:  Concentration: Fair  Recall:  [employer] of Knowledge:  Fair  Language:  Fair  Akathisia:  No  Handed:  Right  AIMS (if indicated):     Assets:  Communication Skills Desire for Improvement Housing Physical Health  ADL's:  Intact  Cognition:  WNL  Sleep:        Treatment Plan Summary: Plan 57 year old man with situational anxiety and depression that is now improving.  No sign of dangerousness.  Affect was upbeat and appropriate.  No sign of thought disorder.  Medically stable.  Patient does not meet commitment criteria nor does he require inpatient treatment.  He does not require any change to his medicine.  Spent some time doing supportive therapy and encouragement and encouraged him to follow up with his doctor and consider seeing a therapist.  Patient can be released from the emergency room at the discretion of the ER physician  Disposition: No evidence of imminent risk to self or others at present.   Patient does not meet criteria for psychiatric inpatient admission. Supportive therapy provided about ongoing stressors.  Alethia Berthold, MD 09/21/2017 5:15 PM

## 2017-09-28 ENCOUNTER — Encounter: Payer: Self-pay | Admitting: Emergency Medicine

## 2017-09-28 ENCOUNTER — Emergency Department
Admission: EM | Admit: 2017-09-28 | Discharge: 2017-09-29 | Disposition: A | Discharge status: Other Acute Inpatient Hospital | Payer: Medicaid Other | Attending: Emergency Medicine | Admitting: Emergency Medicine

## 2017-09-28 ENCOUNTER — Other Ambulatory Visit: Payer: Self-pay

## 2017-09-28 DIAGNOSIS — Z5900 Homelessness unspecified: Secondary | ICD-10-CM

## 2017-09-28 DIAGNOSIS — Y908 Blood alcohol level of 240 mg/100 ml or more: Secondary | ICD-10-CM | POA: Insufficient documentation

## 2017-09-28 DIAGNOSIS — F4322 Adjustment disorder with anxiety: Secondary | ICD-10-CM | POA: Diagnosis present

## 2017-09-28 DIAGNOSIS — Z9114 Patient's other noncompliance with medication regimen: Secondary | ICD-10-CM | POA: Insufficient documentation

## 2017-09-28 DIAGNOSIS — F17210 Nicotine dependence, cigarettes, uncomplicated: Secondary | ICD-10-CM | POA: Insufficient documentation

## 2017-09-28 DIAGNOSIS — F329 Major depressive disorder, single episode, unspecified: Secondary | ICD-10-CM | POA: Diagnosis not present

## 2017-09-28 DIAGNOSIS — F10129 Alcohol abuse with intoxication, unspecified: Secondary | ICD-10-CM | POA: Diagnosis not present

## 2017-09-28 DIAGNOSIS — Z008 Encounter for other general examination: Secondary | ICD-10-CM | POA: Insufficient documentation

## 2017-09-28 DIAGNOSIS — F1010 Alcohol abuse, uncomplicated: Secondary | ICD-10-CM

## 2017-09-28 LAB — COMPREHENSIVE METABOLIC PANEL
ALT: 31 U/L (ref 17–63)
AST: 51 U/L — AB (ref 15–41)
Albumin: 5 g/dL (ref 3.5–5.0)
Alkaline Phosphatase: 66 U/L (ref 38–126)
Anion gap: 10 (ref 5–15)
BILIRUBIN TOTAL: 0.5 mg/dL (ref 0.3–1.2)
BUN: 9 mg/dL (ref 6–20)
CO2: 28 mmol/L (ref 22–32)
Calcium: 9 mg/dL (ref 8.9–10.3)
Chloride: 100 mmol/L — ABNORMAL LOW (ref 101–111)
Creatinine, Ser: 0.77 mg/dL (ref 0.61–1.24)
GFR calc Af Amer: >60 mL/min (ref >60)
Glucose, Bld: 97 mg/dL (ref 65–99)
POTASSIUM: 4.2 mmol/L (ref 3.5–5.1)
Sodium: 138 mmol/L (ref 135–145)
TOTAL PROTEIN: 8.2 g/dL — AB (ref 6.5–8.1)

## 2017-09-28 LAB — URINE DRUG SCREEN, QUALITATIVE (ARMC ONLY)
Amphetamines, Ur Screen: NOT DETECTED
BARBITURATES, UR SCREEN: NOT DETECTED
BENZODIAZEPINE, UR SCRN: NOT DETECTED
Cannabinoid 50 Ng, Ur ~~LOC~~: NOT DETECTED
Cocaine Metabolite,Ur ~~LOC~~: NOT DETECTED
MDMA (Ecstasy)Ur Screen: NOT DETECTED
Methadone Scn, Ur: NOT DETECTED
Opiate, Ur Screen: NOT DETECTED
PHENCYCLIDINE (PCP) UR S: NOT DETECTED
Tricyclic, Ur Screen: NOT DETECTED

## 2017-09-28 LAB — URINALYSIS, ROUTINE W REFLEX MICROSCOPIC
BILIRUBIN URINE: NEGATIVE
Glucose, UA: NEGATIVE mg/dL
HGB URINE DIPSTICK: NEGATIVE
Ketones, ur: NEGATIVE mg/dL
Leukocytes, UA: NEGATIVE
Nitrite: NEGATIVE
PROTEIN: NEGATIVE mg/dL
Specific Gravity, Urine: 1.008 (ref 1.005–1.030)
pH: 6 (ref 5.0–8.0)

## 2017-09-28 LAB — CBC
HCT: 46 % (ref 40.0–52.0)
Hemoglobin: 15.4 g/dL (ref 13.0–18.0)
MCH: 32.3 pg (ref 26.0–34.0)
MCHC: 33.6 g/dL (ref 32.0–36.0)
MCV: 96.3 fL (ref 80.0–100.0)
PLATELETS: 316 10*3/uL (ref 150–440)
RBC: 4.77 MIL/uL (ref 4.40–5.90)
RDW: 15.3 % — AB (ref 11.5–14.5)
WBC: 5.8 10*3/uL (ref 3.8–10.6)

## 2017-09-28 LAB — ACETAMINOPHEN LEVEL: Acetaminophen (Tylenol), Serum: <10 ug/mL — ABNORMAL LOW (ref 10–30)

## 2017-09-28 LAB — ETHANOL: ALCOHOL ETHYL (B): 262 mg/dL — AB (ref <10)

## 2017-09-28 LAB — SALICYLATE LEVEL: Salicylate Lvl: <7 mg/dL (ref 2.8–30.0)

## 2017-09-28 MED ORDER — QUETIAPINE FUMARATE 25 MG PO TABS
25.0000 mg | ORAL_TABLET | Freq: Every day | ORAL | Status: DC
  Administered 2017-09-28: 25 mg via ORAL
  Filled 2017-09-28: qty 1

## 2017-09-28 MED ORDER — VENLAFAXINE HCL ER 75 MG PO CP24
75.0000 mg | ORAL_CAPSULE | Freq: Every day | ORAL | Status: DC
  Filled 2017-09-28: qty 1

## 2017-09-28 NOTE — ED Notes (Signed)
Ian Cooper Ian Cooper,  Supervisor [facility] returned my call and stated the appropriate point of contact for Ian Cooper is Ian Cooper, DSS Social Worker of whom I spoke with in person, therefore, pt's legal guardian has been updated on pt status at this time.

## 2017-09-28 NOTE — ED Notes (Signed)
BEHAVIORAL HEALTH ROUNDING Patient sleeping: No. Patient alert and oriented: yes Behavior appropriate: Yes.  ; If no, describe:  Nutrition and fluids offered: yes Toileting and hygiene offered: Yes  Sitter present: q15 minute observations and security  monitoring Law enforcement present: Yes  ODS  

## 2017-09-28 NOTE — ED Notes (Signed)

## 2017-09-28 NOTE — ED Triage Notes (Signed)
Pt presents to ED via [facility], states found patient living under a bridge, states patient has not eaten in approx 4 days, and has been out of his medications for approx 1 week. Pt states has had 2 40 ounce beers today. DSS worker states that pt was brought in on 1/1 and D/C to the lobby without the assisted living facility being contacted to come get him, pt then walked to a bridge in [place] [employer]. Pt's social worker states pt states intermittently that he wishes he would not "wake up". Pt denies SI/HI at this time.

## 2017-09-28 NOTE — ED Notes (Signed)
This RN spoke in person with pts social worker, Albina BilletMeaghan Tyson who found patient and brought him to ED for evaluation. She states that she notified her executive direction [facility] that pt was currently in the Emergency Department.

## 2017-09-28 NOTE — ED Notes (Signed)
This RN spoke with Ian Cooper, pt's social worker regarding copies of patient's legal guardian paperwork. Ian Cooper given fax number to hospital [phone] and states she will fax copies of patient's legal guardian paperwork upon her return to DSS agency.

## 2017-09-28 NOTE — ED Notes (Signed)
Pt. To BHU from ED ambulatory without difficulty, to room  BHU1. Report from Rebecca RN. Pt. Is alert and oriented, warm and dry in no distress. Pt. Denies SI, HI, and AVH. Pt. Calm and cooperative. Pt. Made aware of security cameras and Q15 minute rounds. Pt. Encouraged to let Nursing staff know of any concerns or needs.   

## 2017-09-28 NOTE — ED Notes (Signed)
Pt given food tray.

## 2017-09-28 NOTE — ED Notes (Signed)
Attempted to call his guardian Albina BilletMeaghan Tyson [phone] but received no answer  - per psychiatry note pt cleared to discharge   EDP reports that he spoke with [facility] and they request that the pt get back on his meds and stay overnight then discharge in the am

## 2017-09-28 NOTE — ED Notes (Signed)
This RN spoke with Ian Cooper [phone], executive director of [facility] that patient is in ED. I asked for a direct number to reach pts legal guardian, she gave me a name Ian Cooper, who I called and no longer works there. I was directed to call Ian Cooper, the Director of [facility] dss, at [phone] who told me to notify Ian Cooper. Call attempted, message left on voicemail.

## 2017-09-28 NOTE — Clinical Social Work Note (Signed)
CSW received a social work consult for "not taking meds, homeless." CSW spoke with EDP and Theme park managerGuardianship Supervisor Kailee. Pt is a ward of the state and a resident at [facility]. Pt has an [facility] DSS Volney PresserGuardian-Meaghan Chales Abrahamsyson [phone]. Guardianship supervisor is Sara LeeKailee Morrow-Jennings [phone]-cell. Pt will stay overnight for observation. Upon pt discharge, Meaghan (or Milinda PointerKailee if Meaghan cannot be reached) is to be contacted for transport back to [facility]. CSW continuing to follow for discharge needs.   Corlis HoveJeneya Zeinab Rodwell, Theresia MajorsLCSWA, [facility] Clinical Social Worker-ED [phone]

## 2017-09-28 NOTE — ED Notes (Signed)

## 2017-09-28 NOTE — ED Notes (Signed)
[facility] once vitals obtained.

## 2017-09-28 NOTE — ED Notes (Signed)
This RN spoke with Amy, RN who took over care of patient. This RN notified Amy that pt's social worker Albina BilletMeaghan Tyson is requesting that patient's social worker or the after hours social worker be notified when patient is discharged instead of [facility].

## 2017-09-28 NOTE — ED Provider Notes (Signed)
[facility] Emergency Department Provider Note  Time seen: 4:39 PM  I have reviewed the triage vital signs and the nursing notes.   HISTORY  Chief Complaint Psychiatric Evaluation    HPI Ian Cooper is a 57 y.o. male with a past medical history of anxiety, depression, alcohol abuse, presents to the emergency department brought in by his social worker for homelessness, not taking medications, drinking alcohol and not caring for himself.  According to triage per the social worker the patient is homeless, has been off of his medications and is drinking alcohol.  Patient admits to drinking alcohol, denies any drug use.  Largely negative review of systems besides intermittent abdominal pain.  Patient denies any SI or HI, but would like to talk to someone about a new living arrangement.  Given his homelessness.  Has not eaten in approximately 3 or 4 days, states he is hungry currently.  According to record review patient was just here approximately 1 week ago.  At that time was seen by psychiatry.  No SI, no HI, no psychiatric complaints at this time.   Past Medical History:  Diagnosis Date  . Anxiety   . Depression   . H/O ETOH abuse   . Insomnia     Patient Active Problem List   Diagnosis Date Noted  . Adjustment disorder with anxiety 09/21/2017    History reviewed. No pertinent surgical history.  Prior to Admission medications   Not on File    No Known Allergies  History reviewed. No pertinent family history.  Social History Social History   Tobacco Use  . Smoking status: Current Every Day Smoker    Packs/day: 1.00    Types: Cigarettes  . Smokeless tobacco: Never Used  Substance Use Topics  . Alcohol use: Yes  . Drug use: No    Review of Systems Constitutional: Negative for fever. Eyes: Negative for visual complaints. ENT: Negative for congestion Cardiovascular: Negative for chest pain. Respiratory: Negative for shortness of  breath. Gastrointestinal: Intermittent abdominal pain.  Negative for vomiting or diarrhea. Genitourinary: Negative for dysuria. Musculoskeletal: Negative for back pain. Skin: Negative for rash. Neurological: Negative for headache Psychiatric: Negative for SI or HI. All other ROS negative  ____________________________________________   PHYSICAL EXAM:  VITAL SIGNS: ED Triage Vitals  Enc Vitals Group     BP 09/28/17 1537 (!) 156/97     Pulse Rate 09/28/17 1537 (!) 102     Resp 09/28/17 1537 18     Temp 09/28/17 1537 97.8 F (36.6 C)     Temp Source 09/28/17 1537 Oral     SpO2 09/28/17 1550 99 %     Weight 09/28/17 1537 132 lb (59.9 kg)     Height 09/28/17 1537 5\' 11"  (1.803 m)     Head Circumference --      Peak Flow --      Pain Score --      Pain Loc --      Pain Edu? --      Excl. in GC? --    Constitutional: Alert and oriented. Well appearing and in no distress. Eyes: Normal exam ENT   Head: Normocephalic and atraumatic.   Mouth/Throat: Mucous membranes are moist. Cardiovascular: Normal rate, regular rhythm. No murmur Respiratory: Normal respiratory effort without tachypnea nor retractions. Breath sounds are clear  Gastrointestinal: Soft, mild diffuse tenderness.  No rebound or guarding.  No distention. Musculoskeletal: Nontender with normal range of motion in all extremities.  Neurologic:  Normal  speech and language. No gross focal neurologic deficits Skin:  Skin is warm, dry and intact.  Psychiatric: Mood and affect are normal.   ____________________________________________   INITIAL IMPRESSION / ASSESSMENT AND PLAN / ED COURSE  Pertinent labs & imaging results that were available during my care of the patient were reviewed by me and considered in my medical decision making (see chart for details).  Patient presents to the emergency department by his social worker for homelessness.  Currently the patient has no medical complaints besides intermittent  abdominal pain.  Patient's labs including white blood cell count and chemistry are normal.  Patient's alcohol level is 262.  Urinalysis and urine drug screen pending.  We will have the patient seen by TTS as well as Child psychotherapist for possible placement.  Patient has no SI, HI or psychiatric complaints at this time.  Was recently evaluated by psychiatry less than 1 week ago, do not believe the patient is in need of repeat psychiatric evaluation and there is currently no criteria to place the patient under IVC.  Psychiatry is seen the patient, believe she is safe for discharge home back to his living facility.  Patient has a guardian which is DSS.  I discussed with the nurse, she will call the assist to inform them of the patient's location.  We discussed with the patient's living facility [facility] they are requesting that we restart the patient's medications and watch him overnight tonight.  I believe this is a reasonable plan of care given his alcohol intoxication of 262, and he has been off medications for 1 week.  I have ordered his Effexor and Seroquel.  We will monitor overnight and likely discharge to [facility] health tomorrow. ____________________________________________   FINAL CLINICAL IMPRESSION(S) / ED DIAGNOSES  Homelessness Medication noncompliance    Minna Antis, MD 09/28/17 2325

## 2017-09-28 NOTE — Consult Note (Signed)
[facility] Consult   Reason for Consult: Consult for 57 year old man who was brought in by DSS and law enforcement [place] Referring Physician: Paduchowski Patient Identification: Ian Cooper MRN:  [id number] Principal Diagnosis: Alcohol abuse Diagnosis:   Patient Active Problem List   Diagnosis Date Noted  . Alcohol abuse [F10.10] 09/28/2017  . Adjustment disorder with anxiety [F43.22] 09/21/2017    Total Time spent with patient: 30 minutes  Subjective:   Ian Cooper is a 57 y.o. male patient admitted with "I am not feeling good"  HPI: Patient seen chart reviewed.  This 57 year old man who was seen here in the emergency week was brought in today after being found by DSS and law enforcement living [place] a bridge locally.  The patient states he has not been sleeping well for several days.  Has hardly eaten.  He has been drinking beer and estimates he had 240 ounces today.  Patient is feeling run down and tired but denies suicidal or homicidal thoughts.  Denies psychosis.  He is alert and oriented.  No specific new physical complaint.  He has already eaten a meal since being here.  Patient says that he does not want to go back to [place] where he is currently living but except that his guardian has chosen to place him there and he has no other place to go for now.  Social history: Patient has a legal guardian.  He is currently living at [facility].  Apparently he had been married previously but is not allowed to have contact with his wife.  He reports that he dislikes his living quarters now.  Medical history: Denies any significant ongoing known medical problems.  Absence abuse history: History of alcohol abuse  Past Psychiatric History: No prior hospitalizations.  No history of suicide attempts or violence.  Does have a history of alcohol abuse and says he has had seizures in the past coming off of alcohol.  Risk to Self: Is  patient at risk for suicide?: No, but patient needs Medical Clearance Risk to Others:   Prior Inpatient Therapy:   Prior Outpatient Therapy:    Past Medical History:  Past Medical History:  Diagnosis Date  . Anxiety   . Depression   . H/O ETOH abuse   . Insomnia    History reviewed. No pertinent surgical history. Family History: History reviewed. No pertinent family history. Family Psychiatric  History: None known Social History:  Social History   Substance and Sexual Activity  Alcohol Use Yes     Social History   Substance and Sexual Activity  Drug Use No    Social History   Socioeconomic History  . Marital status: Divorced    Spouse name: None  . Number of children: None  . Years of education: None  . Highest education level: None  Social Needs  . Financial resource strain: None  . Food insecurity - worry: None  . Food insecurity - inability: None  . Transportation needs - medical: None  . Transportation needs - non-medical: None  Occupational History  . None  Tobacco Use  . Smoking status: Current Every Day Smoker    Packs/day: 1.00    Types: Cigarettes  . Smokeless tobacco: Never Used  Substance and Sexual Activity  . Alcohol use: Yes  . Drug use: No  . Sexual activity: None  Other Topics Concern  . None  Social History Narrative  . None   Additional Social History:  Allergies:  No Known Allergies  Labs:  Results for orders placed or performed during the hospital encounter of 09/28/17 (from the past 48 hour(s))  Comprehensive metabolic panel     Status: Abnormal   Collection Time: 09/28/17  3:49 PM  Result Value Ref Range   Sodium 138 135 - 145 mmol/L   Potassium 4.2 3.5 - 5.1 mmol/L   Chloride 100 (L) 101 - 111 mmol/L   CO2 28 22 - 32 mmol/L   Glucose, Bld 97 65 - 99 mg/dL   BUN 9 6 - 20 mg/dL   Creatinine, Ser 0.77 0.61 - 1.24 mg/dL   Calcium 9.0 8.9 - 10.3 mg/dL   Total Protein 8.2 (H) 6.5 - 8.1 g/dL   Albumin 5.0 3.5 - 5.0 g/dL    AST 51 (H) 15 - 41 U/L   ALT 31 17 - 63 U/L   Alkaline Phosphatase 66 38 - 126 U/L   Total Bilirubin 0.5 0.3 - 1.2 mg/dL   GFR calc non Af Amer >60 >60 mL/min   GFR calc Af Amer >60 >60 mL/min    Comment: (NOTE) The eGFR has been calculated using the CKD EPI equation. This calculation has not been validated in all clinical situations. eGFR's persistently <60 mL/min signify possible Chronic Kidney Disease.    Anion gap 10 5 - 15    Comment: Performed at [facility], [address] [place], [place] [address]  Ethanol     Status: Abnormal   Collection Time: 09/28/17  3:49 PM  Result Value Ref Range   Alcohol, Ethyl (B) 262 (H) <10 mg/dL    Comment:        LOWEST DETECTABLE LIMIT FOR SERUM ALCOHOL IS 10 mg/dL FOR MEDICAL PURPOSES ONLY Performed at [facility], [address] [place], [place] [address]   Salicylate level     Status: None   Collection Time: 09/28/17  3:49 PM  Result Value Ref Range   Salicylate Lvl <0.9 2.8 - 30.0 mg/dL    Comment: Performed at [facility], [address] [place], [place] [address]  Acetaminophen level     Status: Abnormal   Collection Time: 09/28/17  3:49 PM  Result Value Ref Range   Acetaminophen (Tylenol), Serum <10 (L) 10 - 30 ug/mL    Comment:        THERAPEUTIC CONCENTRATIONS VARY SIGNIFICANTLY. A RANGE OF 10-30 ug/mL MAY BE AN EFFECTIVE CONCENTRATION FOR MANY PATIENTS. HOWEVER, SOME ARE BEST TREATED AT CONCENTRATIONS OUTSIDE THIS RANGE. ACETAMINOPHEN CONCENTRATIONS >150 ug/mL AT 4 HOURS AFTER INGESTION AND >50 ug/mL AT 12 HOURS AFTER INGESTION ARE OFTEN ASSOCIATED WITH TOXIC REACTIONS. Performed at [facility], [address] [place], [place] [address]   cbc     Status: Abnormal   Collection Time: 09/28/17  3:49 PM  Result Value Ref Range   WBC 5.8 3.8 - 10.6 K/uL   RBC 4.77 4.40 - 5.90 MIL/uL   Hemoglobin 15.4 13.0 - 18.0 g/dL   HCT 46.0 40.0 - 52.0 %   MCV 96.3 80.0 - 100.0 fL    MCH 32.3 26.0 - 34.0 pg   MCHC 33.6 32.0 - 36.0 g/dL   RDW 15.3 (H) 11.5 - 14.5 %   Platelets 316 150 - 440 K/uL    Comment: Performed at [facility] [address] [place], [place] [address]    No current facility-administered medications for this encounter.    No current outpatient medications on file.  Musculoskeletal: Strength & Muscle Tone: within normal limits Gait & Station: normal Patient leans: N/A  Psychiatric Specialty Exam: Physical Exam  Nursing note and vitals reviewed. Constitutional: He appears well-developed and well-nourished.  HENT:  Head: Normocephalic and atraumatic.  Eyes: Conjunctivae are normal. Pupils are equal, round, and reactive to light.  Neck: Normal range of motion.  Cardiovascular: Regular rhythm and normal heart sounds.  Respiratory: Effort normal. No respiratory distress.  GI: Soft.  Musculoskeletal: Normal range of motion.  Neurological: He is alert.  Skin: Skin is warm and dry.  Psychiatric: His affect is blunt. His speech is delayed. He is slowed and withdrawn. Thought content is not paranoid. Cognition and memory are impaired. He expresses impulsivity. He does not exhibit a depressed mood. He expresses no homicidal and no suicidal ideation.    Review of Systems  Constitutional: Negative.   HENT: Negative.   Eyes: Negative.   Respiratory: Negative.   Cardiovascular: Negative.   Gastrointestinal: Negative.   Musculoskeletal: Negative.   Skin: Negative.   Neurological: Negative.   Psychiatric/Behavioral: Positive for substance abuse. Negative for depression, hallucinations, memory loss and suicidal ideas. The patient has insomnia. The patient is not nervous/anxious.     Blood pressure 131/76, pulse 95, temperature 97.9 F (36.6 C), temperature source Oral, resp. rate 18, height '5\' 11"'  (1.803 m), weight 59.9 kg (132 lb), SpO2 99 %.Body mass index is 18.41 kg/m.  General Appearance: Disheveled  Eye Contact:  Fair   Speech:  Slow  Volume:  Decreased  Mood:  Dysphoric  Affect:  Congruent  Thought Process:  Goal Directed  Orientation:  Full (Time, Place, and Person)  Thought Content:  Logical  Suicidal Thoughts:  No  Homicidal Thoughts:  No  Memory:  Immediate;   Good Recent;   Fair Remote;   Fair  Judgement:  Fair  Insight:  Fair  Psychomotor Activity:  Decreased  Concentration:  Concentration: Fair  Recall:  [employer] of Knowledge:  Fair  Language:  Fair  Akathisia:  No  Handed:  Right  AIMS (if indicated):     Assets:  Agricultural consultant Housing Social Support  ADL's:  Impaired  Cognition:  Impaired,  Mild  Sleep:        Treatment Plan Summary: Plan 57 year old man with alcohol abuse and possibly a mild degree of dementia but he is alert and oriented now and understands his situation.  Patient understands that because of his guardian he does not have a choice about going back to live at [facility].  He is not reporting any suicidal thoughts or any psychiatric symptoms requiring hospitalization.  I spoke with nursing and with the emergency room doctor.  I attempted to call his guardian at the telephone number listed in the chart but the answering voicemail said that the mailbox was full and I could not leave a message.  No further immediate psychiatric treatment.  No need for IVC.  Disposition: Patient does not meet criteria for psychiatric inpatient admission. Supportive therapy provided about ongoing stressors.  Alethia Berthold, MD 09/28/2017 4:57 PM

## 2017-09-29 NOTE — ED Provider Notes (Signed)
Guardian here to pick patient up - will dispo with recs for pcp and mental health/substance abuse follow up.   Governor RooksLord, Aldrin Engelhard, MD 09/29/17 1056

## 2017-09-29 NOTE — ED Provider Notes (Signed)
-----------------------------------------   9:42 AM on 09/29/2017 -----------------------------------------  Vitals:   09/29/17 0640 09/29/17 0811  BP: (!) 153/98 (!) 150/91  Pulse: 82 68  Resp: 16 16  Temp: 98.6 F (37 C) 98 F (36.7 C)  SpO2: 97% 98%    Patient is voluntary, but he does have a guardian and [facility] is working on placement back to his group home placement.  I reviewed Dr. Toni Amendlapacs, psychiatry note from yesterday.  No indication for hospitalization emergently, but patient has a guardian and needs to go back to the group home.   Governor RooksLord, Farzad Tibbetts, MD 09/29/17 [phone]

## 2017-09-29 NOTE — Progress Notes (Signed)
Belongings returned to pt on discharge. Pt discharged ambulatory with his social worker/legal guardian, Jeralene HuffMeghan Tyson who was wearing identification [phone], [phone]. Ms. Chales Abrahamsyson verbalized understanding of discharge instructions.

## 2017-09-29 NOTE — Discharge Instructions (Signed)
Return to emergency department immediately for any thoughts of wanting to hurt yourself or others, or any other symptoms concerning to you.

## 2017-09-29 NOTE — ED Notes (Signed)
Spoke with Jeralene HuffMeghan Tyson [phone], pt's legal guardian/social worker. She states she will pick pt up within the next couple hours to transport him home.

## 2017-09-29 NOTE — ED Notes (Signed)
Pt awake this morning, eating breakfast provided. Calm and cooperative, asking when his legal guardian will be here to pick him up and transport back to home. Will call guardian to find out planned time of arrival. VS stable. Denies SI/HI/AVH. Safety maintained with every 15 minute checks, security cameras in place. Will continue to monitor.

## 2017-09-29 NOTE — ED Notes (Signed)
Pt's guardian/social worker here to pick up pt. Informed ED provider. Will discharge once order is placed.

## 2017-09-29 NOTE — ED Notes (Signed)
VOL/ Patient D/C'd

## 2019-07-25 ENCOUNTER — Other Ambulatory Visit (HOSPITAL_COMMUNITY): Payer: Self-pay | Admitting: Physician Assistant

## 2019-07-25 ENCOUNTER — Other Ambulatory Visit: Payer: Self-pay | Admitting: Physician Assistant

## 2019-07-25 ENCOUNTER — Other Ambulatory Visit: Payer: Self-pay | Admitting: Internal Medicine

## 2019-07-25 DIAGNOSIS — G459 Transient cerebral ischemic attack, unspecified: Secondary | ICD-10-CM

## 2019-07-26 ENCOUNTER — Other Ambulatory Visit (HOSPITAL_COMMUNITY): Payer: Self-pay | Admitting: Physician Assistant

## 2019-07-26 ENCOUNTER — Other Ambulatory Visit: Payer: Self-pay | Admitting: Physician Assistant

## 2019-07-26 DIAGNOSIS — G459 Transient cerebral ischemic attack, unspecified: Secondary | ICD-10-CM

## 2019-08-28 ENCOUNTER — Ambulatory Visit: Payer: Medicaid Other

## 2019-08-28 ENCOUNTER — Other Ambulatory Visit: Payer: Self-pay

## 2019-08-28 ENCOUNTER — Ambulatory Visit (INDEPENDENT_AMBULATORY_CARE_PROVIDER_SITE_OTHER): Payer: Medicaid Other | Admitting: Orthopaedic Surgery

## 2019-08-28 ENCOUNTER — Encounter: Payer: Self-pay | Admitting: Orthopaedic Surgery

## 2019-08-28 VITALS — BP 136/89 | HR 84 | Temp 99.1°F | Ht 70.0 in | Wt 144.0 lb

## 2019-08-28 DIAGNOSIS — F1011 Alcohol abuse, in remission: Secondary | ICD-10-CM

## 2019-08-28 DIAGNOSIS — M25511 Pain in right shoulder: Secondary | ICD-10-CM | POA: Diagnosis not present

## 2019-08-28 DIAGNOSIS — G8929 Other chronic pain: Secondary | ICD-10-CM | POA: Diagnosis not present

## 2019-08-28 NOTE — Progress Notes (Signed)
Subjective:    Patient ID: Ian Cooper, male    DOB: July 19, 1961, 58 y.o.   MRN: [id number]  HPI He has long history of pain of the right shoulder from very remote injury years ago to the right shoulder.  He has pain in the shoulder with motion.  He has had some numbness in the past but none now.  He has pain with attempting overhead use, pain sleeping on it.  He has no redness, no swelling.  Nothing seems to help it.  He has tried Tylenol, ibuprofen, heat and ice.  He has seen his family doctor and I have copies of the notes and I have reviewed them.  He is at [facility] now.  He has history of ETOH abuse in past.   Review of Systems  Constitutional: Positive for activity change.  Musculoskeletal: Positive for arthralgias and joint swelling.  Psychiatric/Behavioral: The patient is nervous/anxious.   All other systems reviewed and are negative.  For Review of Systems, all other systems reviewed and are negative.  The following is a summary of the past history medically, past history surgically, known current medicines, social history and family history.  This information is gathered electronically by the computer from prior information and documentation.  I review this each visit and have found including this information at this point in the chart is beneficial and informative.   Past Medical History:  Diagnosis Date  . Anxiety   . Depression   . H/O ETOH abuse   . Insomnia     No past surgical history on file.  No current outpatient medications on file prior to visit.   No current facility-administered medications on file prior to visit.     Social History   Socioeconomic History  . Marital status: Divorced    Spouse name: Not on file  . Number of children: Not on file  . Years of education: Not on file  . Highest education level: Not on file  Occupational History  . Not on file  Social Needs  . Financial resource strain: Not on file  . Food insecurity    Worry:  Not on file    Inability: Not on file  . Transportation needs    Medical: Not on file    Non-medical: Not on file  Tobacco Use  . Smoking status: Current Every Day Smoker    Packs/day: 1.00    Types: Cigarettes  . Smokeless tobacco: Never Used  Substance and Sexual Activity  . Alcohol use: Yes  . Drug use: No  . Sexual activity: Not on file  Lifestyle  . Physical activity    Days per week: Not on file    Minutes per session: Not on file  . Stress: Not on file  Relationships  . Social Musician on phone: Not on file    Gets together: Not on file    Attends religious service: Not on file    Active member of club or organization: Not on file    Attends meetings of clubs or organizations: Not on file    Relationship status: Not on file  . Intimate partner violence    Fear of current or ex partner: Not on file    Emotionally abused: Not on file    Physically abused: Not on file    Forced sexual activity: Not on file  Other Topics Concern  . Not on file  Social History Narrative  . Not on file  History of heart disease in family.  BP 136/89   Pulse 84   Temp 99.1 F (37.3 C)   Ht 5\' 10"  (1.778 m)   Wt 144 lb (65.3 kg)   BMI 20.66 kg/m   Body mass index is 20.66 kg/m.     Objective:   Physical Exam Vitals signs reviewed.  Constitutional:      Appearance: He is well-developed.  HENT:     Head: Normocephalic and atraumatic.  Eyes:     Conjunctiva/sclera: Conjunctivae normal.     Pupils: Pupils are equal, round, and reactive to light.  Neck:     Musculoskeletal: Normal range of motion and neck supple.  Cardiovascular:     Rate and Rhythm: Normal rate and regular rhythm.  Pulmonary:     Effort: Pulmonary effort is normal.  Abdominal:     Palpations: Abdomen is soft.  Musculoskeletal:     Right shoulder: He exhibits decreased range of motion and tenderness.       Arms:  Skin:    General: Skin is warm and dry.  Neurological:     Mental  Status: He is alert and oriented to person, place, and time.     Cranial Nerves: No cranial nerve deficit.     Motor: No abnormal muscle tone.     Coordination: Coordination normal.     Deep Tendon Reflexes: Reflexes are normal and symmetric. Reflexes normal.  Psychiatric:        Behavior: Behavior normal.        Thought Content: Thought content normal.        Judgment: Judgment normal.     X-rays were done of the right shoulder, reported separately.       Assessment & Plan:   Encounter Diagnoses  Name Primary?  . Chronic pain in right shoulder Yes  . H/O ETOH abuse    PROCEDURE NOTE:  The patient request injection, verbal consent was obtained.  The right shoulder was prepped appropriately after time out was performed.   Sterile technique was observed and injection of 1 cc of Depo-Medrol 40 mg with several cc's of plain xylocaine. Anesthesia was provided by ethyl chloride and a 20-gauge needle was used to inject the shoulder area. A posterior approach was used.  The injection was tolerated well.  A band aid dressing was applied.  The patient was advised to apply ice later today and tomorrow to the injection sight as needed.  Forms for [employer] completed.  Begin one Aleve twice a day after eating.  Return in one month.  Call if any problem.  Precautions discussed.   Electronically Signed Sanjuana Kava, MD 12/8/20209:51 AM

## 2019-09-27 ENCOUNTER — Encounter: Payer: Self-pay | Admitting: Orthopaedic Surgery

## 2019-09-27 ENCOUNTER — Ambulatory Visit: Payer: Medicaid Other | Admitting: Orthopaedic Surgery

## 2020-04-10 ENCOUNTER — Ambulatory Visit: Payer: Medicaid Other | Admitting: Orthopaedic Surgery

## 2020-04-15 ENCOUNTER — Other Ambulatory Visit: Payer: Self-pay

## 2020-04-15 ENCOUNTER — Encounter: Payer: Self-pay | Admitting: Orthopaedic Surgery

## 2020-04-15 ENCOUNTER — Ambulatory Visit (INDEPENDENT_AMBULATORY_CARE_PROVIDER_SITE_OTHER): Payer: Medicaid Other | Admitting: Orthopaedic Surgery

## 2020-04-15 DIAGNOSIS — M25511 Pain in right shoulder: Secondary | ICD-10-CM

## 2020-04-15 DIAGNOSIS — G8929 Other chronic pain: Secondary | ICD-10-CM | POA: Diagnosis not present

## 2020-04-15 DIAGNOSIS — F1011 Alcohol abuse, in remission: Secondary | ICD-10-CM

## 2020-04-15 NOTE — Progress Notes (Signed)
PROCEDURE NOTE:  The patient request injection, verbal consent was obtained.  The right shoulder was prepped appropriately after time out was performed.   Sterile technique was observed and injection of 1 cc of Depo-Medrol 40 mg with several cc's of plain xylocaine. Anesthesia was provided by ethyl chloride and a 20-gauge needle was used to inject the shoulder area. A posterior approach was used.  The injection was tolerated well.  A band aid dressing was applied.  The patient was advised to apply ice later today and tomorrow to the injection sight as needed.  Return as needed.  Electronically Signed Darreld Mclean, MD 7/27/20211:43 PM

## 2022-11-18 ENCOUNTER — Encounter: Payer: Self-pay | Admitting: Radiology
